# Patient Record
Sex: Male | Born: 1975
Health system: Southern US, Community
[De-identification: ages and names within clinical notes are randomized; demographics above are authoritative.]

## PROBLEM LIST (undated history)

## (undated) DIAGNOSIS — F902 Attention-deficit hyperactivity disorder, combined type: Secondary | ICD-10-CM

## (undated) DIAGNOSIS — E039 Hypothyroidism, unspecified: Secondary | ICD-10-CM

## (undated) DIAGNOSIS — R740 Nonspecific elevation of levels of transaminase and lactic acid dehydrogenase [LDH]: Secondary | ICD-10-CM

## (undated) DIAGNOSIS — F988 Other specified behavioral and emotional disorders with onset usually occurring in childhood and adolescence: Secondary | ICD-10-CM

## (undated) DIAGNOSIS — I10 Essential (primary) hypertension: Secondary | ICD-10-CM

## (undated) DIAGNOSIS — K900 Celiac disease: Secondary | ICD-10-CM

## (undated) DIAGNOSIS — G4730 Sleep apnea, unspecified: Secondary | ICD-10-CM

## (undated) DIAGNOSIS — E538 Deficiency of other specified B group vitamins: Secondary | ICD-10-CM

## (undated) HISTORY — DX: Hypothyroidism, unspecified: E03.9

## (undated) HISTORY — DX: Nonspecific elevation of levels of transaminase and lactic acid dehydrogenase (ldh): R74.0

## (undated) HISTORY — DX: Gilbert syndrome: E80.4

## (undated) HISTORY — PX: WISDOM TOOTH EXTRACTION: SHX21

## (undated) HISTORY — DX: Attention-deficit hyperactivity disorder, combined type: F90.2

## (undated) HISTORY — DX: Deficiency of other specified B group vitamins: E53.8

---

## 1998-06-16 ENCOUNTER — Emergency Department (HOSPITAL_COMMUNITY): Admission: EM | Admit: 1998-06-16 | Discharge: 1998-06-16 | Payer: Self-pay | Admitting: Emergency Medicine

## 1999-03-20 ENCOUNTER — Emergency Department (HOSPITAL_COMMUNITY): Admission: EM | Admit: 1999-03-20 | Discharge: 1999-03-20 | Payer: Self-pay | Admitting: Emergency Medicine

## 1999-11-30 ENCOUNTER — Emergency Department (HOSPITAL_COMMUNITY): Admission: EM | Admit: 1999-11-30 | Discharge: 1999-12-01 | Payer: Self-pay | Admitting: Emergency Medicine

## 2003-08-30 DIAGNOSIS — E039 Hypothyroidism, unspecified: Secondary | ICD-10-CM

## 2003-08-30 HISTORY — DX: Hypothyroidism, unspecified: E03.9

## 2003-08-30 HISTORY — DX: Gilbert syndrome: E80.4

## 2004-02-18 ENCOUNTER — Emergency Department (HOSPITAL_COMMUNITY): Admission: EM | Admit: 2004-02-18 | Discharge: 2004-02-18 | Payer: Self-pay | Admitting: Family Medicine

## 2004-02-20 ENCOUNTER — Emergency Department (HOSPITAL_COMMUNITY): Admission: EM | Admit: 2004-02-20 | Discharge: 2004-02-20 | Payer: Self-pay | Admitting: Emergency Medicine

## 2004-02-28 ENCOUNTER — Emergency Department (HOSPITAL_COMMUNITY): Admission: EM | Admit: 2004-02-28 | Discharge: 2004-02-28 | Payer: Self-pay | Admitting: Family Medicine

## 2004-08-24 ENCOUNTER — Emergency Department (HOSPITAL_COMMUNITY): Admission: EM | Admit: 2004-08-24 | Discharge: 2004-08-24 | Payer: Self-pay | Admitting: Family Medicine

## 2004-08-29 DIAGNOSIS — R7401 Elevation of levels of liver transaminase levels: Secondary | ICD-10-CM

## 2004-08-29 DIAGNOSIS — R7402 Elevation of levels of lactic acid dehydrogenase (LDH): Secondary | ICD-10-CM

## 2004-08-29 HISTORY — DX: Elevation of levels of lactic acid dehydrogenase (LDH): R74.02

## 2004-08-29 HISTORY — DX: Elevation of levels of liver transaminase levels: R74.01

## 2004-11-01 ENCOUNTER — Encounter: Admission: RE | Admit: 2004-11-01 | Discharge: 2004-11-01 | Payer: Self-pay | Admitting: Internal Medicine

## 2004-11-01 ENCOUNTER — Ambulatory Visit: Payer: Self-pay | Admitting: Internal Medicine

## 2004-12-23 ENCOUNTER — Ambulatory Visit: Payer: Self-pay | Admitting: Family Medicine

## 2004-12-29 ENCOUNTER — Ambulatory Visit: Payer: Self-pay | Admitting: Internal Medicine

## 2005-10-04 ENCOUNTER — Ambulatory Visit: Payer: Self-pay | Admitting: Internal Medicine

## 2005-11-07 ENCOUNTER — Ambulatory Visit: Payer: Self-pay | Admitting: Internal Medicine

## 2006-03-23 ENCOUNTER — Emergency Department (HOSPITAL_COMMUNITY): Admission: EM | Admit: 2006-03-23 | Discharge: 2006-03-23 | Payer: Self-pay | Admitting: Family Medicine

## 2006-11-13 ENCOUNTER — Emergency Department (HOSPITAL_COMMUNITY): Admission: EM | Admit: 2006-11-13 | Discharge: 2006-11-13 | Payer: Self-pay | Admitting: Family Medicine

## 2006-11-15 ENCOUNTER — Ambulatory Visit: Payer: Self-pay | Admitting: Internal Medicine

## 2007-06-05 ENCOUNTER — Emergency Department (HOSPITAL_COMMUNITY): Admission: EM | Admit: 2007-06-05 | Discharge: 2007-06-05 | Payer: Self-pay | Admitting: Emergency Medicine

## 2009-11-30 ENCOUNTER — Telehealth (INDEPENDENT_AMBULATORY_CARE_PROVIDER_SITE_OTHER): Payer: Self-pay | Admitting: *Deleted

## 2010-09-28 NOTE — Progress Notes (Signed)
Summary: need rx for pinworms/ chart on ledge  Phone Note Call from Patient Call back at [phone]   Caller: Patient Summary of Call: Pt called has a child in the home diagnosed with pinworms, and need to get a rx. Everyone has a rx except him and was informed need to get rx from pcp  Not symptomatic, need rx for precautionary.  -Last seen 10/04/05, not working now, and has not heard from the [facility] [place], last pcp was Dr Alwyn Ren. --Hop chart on your ledge for eval Initial call taken by: Kandice Hams,  November 30, 2009 2:12 PM  Follow-up for Phone Call        he needs to explain situationto his child's Pediatrician to see if he'll treat other family members. I'm sorry but I can not Rx not having seen him for 4 years Follow-up by: Marga Melnick MD,  November 30, 2009 4:41 PM  Additional Follow-up for Phone Call Additional follow up Details #1::        pt informed .Kandice Hams  November 30, 2009 5:21 PM  Additional Follow-up by: Kandice Hams,  November 30, 2009 5:21 PM

## 2011-11-17 ENCOUNTER — Telehealth: Payer: Self-pay | Admitting: Family Medicine

## 2011-11-17 NOTE — Telephone Encounter (Signed)
Please advise 

## 2011-11-17 NOTE — Telephone Encounter (Signed)
rec to stay with [facility]avors or go to the [facility]

## 2011-11-17 NOTE — Telephone Encounter (Signed)
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 He would like to Re-Establish here, would you be willing to take him on as a new Patient? Please review & advise & I will call Patient back  Pat. Ph# [phone]

## 2012-01-10 ENCOUNTER — Encounter: Payer: Self-pay | Admitting: Internal Medicine

## 2012-01-16 ENCOUNTER — Ambulatory Visit: Payer: Self-pay | Admitting: Family Medicine

## 2012-03-07 ENCOUNTER — Encounter: Payer: Self-pay | Admitting: Internal Medicine

## 2012-03-07 ENCOUNTER — Ambulatory Visit (INDEPENDENT_AMBULATORY_CARE_PROVIDER_SITE_OTHER): Payer: 59 | Admitting: Internal Medicine

## 2012-03-07 VITALS — BP 134/80 | HR 80 | Temp 98.4°F | Resp 14 | Ht 72.3 in | Wt 251.8 lb

## 2012-03-07 DIAGNOSIS — Z0000 Encounter for general adult medical examination without abnormal findings: Secondary | ICD-10-CM

## 2012-03-07 NOTE — Progress Notes (Signed)
  Subjective:    Patient ID: Micheal [name], male    DOB: [birth year], 36 y.o.   MRN: [id number]  HPI  Mr Coronado is here for a physical;acute issues are noted below.      Review of Systems Approximately 12-18 months ago he was placed on low-dose thyroid replacement for abnormal thyroid function tests. His TSH had been 5.77 in 2005; free T4 had been low normal at 0.8. He denies excessive fatigue but he does work third shift with variable schedules. He denies blurred vision, double vision, or loss of vision. He does not have constipation but intermittently has loose stool. He is concerned he may have irritable bowel syndrome. He has noted intermittent cramping in the thorax, thigh, or calves. This is especially prominent after exercise.     Objective:   Physical Exam Gen.:  well-nourished in appearance. Alert, appropriate and cooperative throughout exam. Head: Normocephalic without obvious abnormalities;  pattern alopecia ; beard & moustache Eyes: No corneal or conjunctival inflammation noted. Pupils equal round reactive to light and accommodation. Fundal exam is benign without hemorrhages, exudate, papilledema. Extraocular motion intact. Vision grossly normal.No lid lag Ears: External  ear exam reveals no significant lesions or deformities. Canals clear .TMs normal. Hearing is grossly normal bilaterally. Nose: External nasal exam reveals no deformity or inflammation. Nasal mucosa are pink and moist. No lesions or exudates noted.   Mouth: Oral mucosa and oropharynx reveal no lesions or exudates. Teeth in good repair. Neck: No deformities, masses, or tenderness noted. Range of motion normal Thyroid full without nodularity. Right lobe larger than left Lungs: Normal respiratory effort; chest expands symmetrically. Lungs are clear to auscultation without rales, wheezes, or increased work of breathing. Heart: Normal rate and rhythm. Normal S1 and S2. No gallop, click, or rub. No murmur. Abdomen:  Bowel sounds normal; abdomen soft and nontender. No masses, organomegaly or hernias noted. Genitalia/ DRE: Genitalia normal except forR epididymal granuloma. Prostate is normal without enlargement, asymmetry, nodularity, or induration.                                             Musculoskeletal/extremities: No deformity or scoliosis noted of  the thoracic or lumbar spine. No clubbing, cyanosis, edema, or deformity noted. Range of motion  normal .Tone & strength  normal.Joints normal. Nail health  Good; no onycholysis. Vascular: Carotid, radial artery, dorsalis pedis and  posterior tibial pulses are full and equal. No bruits present. Neurologic: Alert and oriented x3. Deep tendon reflexes symmetrical and normal.No tremor          Skin: Intact without suspicious lesions or rashes. Lymph: No cervical, axillary, or inguinal lymphadenopathy present. Psych: Mood and affect are normal. Normally interactive                                                                                         Assessment & Plan:  #1 comprehensive physical exam; no acute findings #2 see Problem List with Assessments & Recommendations Plan: see Orders

## 2012-03-07 NOTE — Patient Instructions (Addendum)
Preventive Health Care: Exercise at least 30-45 minutes a day,  3-4 days a week.  Eat a low-fat diet with lots of fruits and vegetables, up to 7-9 servings per day. Avoid obesity; your goal is waist measurement < 40 inches.Consume less than 40 grams of sugar per day from foods & drinks with High Fructose Corn Sugar as # 1,2,3 or # 4 on label. Blood Pressure Goal  Ideally is an AVERAGE < 135/85. This AVERAGE should be calculated from @ least 5-7 BP readings taken @ different times of day on different days of week. You should not respond to isolated BP readings , but rather the AVERAGE for that week . Please  schedule fasting Labs : BMET,Lipids, hepatic panel, CBC & dif,free T4,free 3, TSH. PLEASE BRING THESE INSTRUCTIONS TO FOLLOW UP  LAB APPOINTMENT.This will guarantee correct labs are drawn, eliminating need for repeat blood sampling ( needle sticks ! ). Diagnoses /Codes: V70.0. Please try to go on My Chart within the next 24 hours to allow me to release the results directly to you.

## 2012-03-08 ENCOUNTER — Other Ambulatory Visit (INDEPENDENT_AMBULATORY_CARE_PROVIDER_SITE_OTHER): Payer: 59

## 2012-03-08 DIAGNOSIS — Z0000 Encounter for general adult medical examination without abnormal findings: Secondary | ICD-10-CM

## 2012-03-08 LAB — BASIC METABOLIC PANEL
CO2: 26 mEq/L (ref 19–32)
Calcium: 8.9 mg/dL (ref 8.4–10.5)
Chloride: 105 mEq/L (ref 96–112)
Creatinine, Ser: 1.1 mg/dL (ref 0.4–1.5)
Sodium: 139 mEq/L (ref 135–145)

## 2012-03-08 LAB — HEPATIC FUNCTION PANEL
ALT: 38 U/L (ref 0–53)
AST: 28 U/L (ref 0–37)
Albumin: 4.2 g/dL (ref 3.5–5.2)
Bilirubin, Direct: 0.1 mg/dL (ref 0.0–0.3)
Total Bilirubin: 1.4 mg/dL — ABNORMAL HIGH (ref 0.3–1.2)

## 2012-03-08 LAB — CBC WITH DIFFERENTIAL/PLATELET
Basophils Relative: 0.5 % (ref 0.0–3.0)
Eosinophils Absolute: 0.1 10*3/uL (ref 0.0–0.7)
Lymphs Abs: 2.3 10*3/uL (ref 0.7–4.0)
MCHC: 34 g/dL (ref 30.0–36.0)
MCV: 90 fl (ref 78.0–100.0)
Monocytes Absolute: 0.5 10*3/uL (ref 0.1–1.0)
Monocytes Relative: 8.1 % (ref 3.0–12.0)
Neutrophils Relative %: 54 % (ref 43.0–77.0)
Platelets: 151 10*3/uL (ref 150.0–400.0)

## 2012-03-08 LAB — LIPID PANEL: Triglycerides: 105 mg/dL (ref 0.0–149.0)

## 2012-03-08 LAB — T4, FREE: Free T4: 0.7 ng/dL (ref 0.60–1.60)

## 2012-03-08 LAB — TSH: TSH: 7.34 u[IU]/mL — ABNORMAL HIGH (ref 0.35–5.50)

## 2012-03-08 LAB — T3, FREE: T3, Free: 3 pg/mL (ref 2.3–4.2)

## 2012-03-09 ENCOUNTER — Other Ambulatory Visit: Payer: Self-pay

## 2012-03-09 MED ORDER — LEVOTHYROXINE SODIUM 25 MCG PO TABS: 25.0000 ug | ORAL_TABLET | Freq: Every day | ORAL | Status: DC

## 2012-03-09 NOTE — Telephone Encounter (Signed)
Per Dr.Hopper: Labs sent through Mychart, send rx to local pharmacy

## 2012-05-02 ENCOUNTER — Ambulatory Visit (INDEPENDENT_AMBULATORY_CARE_PROVIDER_SITE_OTHER): Payer: 59 | Admitting: Family

## 2012-05-02 ENCOUNTER — Encounter: Payer: Self-pay | Admitting: Family

## 2012-05-02 VITALS — BP 130/88 | HR 86 | Temp 98.6°F | Resp 16 | Wt 249.1 lb

## 2012-05-02 DIAGNOSIS — J40 Bronchitis, not specified as acute or chronic: Secondary | ICD-10-CM

## 2012-05-02 MED ORDER — AZITHROMYCIN 250 MG PO TABS: ORAL_TABLET | ORAL | Status: AC

## 2012-05-02 MED ORDER — HYDROCOD POLST-CHLORPHEN POLST 10-8 MG/5ML PO LQCR: 5.0000 mL | Freq: Every evening | ORAL | Status: DC | PRN

## 2012-05-02 MED ORDER — FLUTICASONE-SALMETEROL 250-50 MCG/DOSE IN AEPB: 1.0000 | INHALATION_SPRAY | Freq: Two times a day (BID) | RESPIRATORY_TRACT | Status: DC

## 2012-05-02 NOTE — Assessment & Plan Note (Signed)
Will plan to rx with zithromax.  Add HS Tussionex for cough.  Also, will plan to treat with advair bid for the next week to help with suspected bronchochospastic component contributing to his cough.

## 2012-05-02 NOTE — Patient Instructions (Addendum)
Please call if your symptoms worsen or if you are not feeling better in 2-3 days.  

## 2012-05-02 NOTE — Progress Notes (Signed)
Subjective:    Patient ID: Micheal [name], male    DOB: Oct 12, 1975, 36 y.o.   MRN: [id number]  HPI  [name] is a 36 yr old male who presents today with chief complaint of cough.  Cough started 9 days ago and is productive of green sputum.  Reports symptoms are worsening. Reports that the floors at his work were recently refinished and the fumes have been bothering him. He reports associated nasal congestion. Using sudafed.  Can't sleep at night due to cough.  Tried delsym, robitussin extra strength.  Neither helped much.  He reports fever 99 something late last week.    Review of Systems See HPI  Past Medical History  Diagnosis Date  . Gilbert syndrome 2005    total bilirubin  1.8  . Nonspecific elevation of levels of transaminase or lactic acid dehydrogenase (LDH) 2006     ALT 51  . Abnormal thyroid blood test 2005    free T4 0.8; TSH 5.77    History   Social History  . Marital Status: Married    Spouse Name: N/A    Number of Children: N/A  . Years of Education: N/A   Occupational History  . Not on file.   Social History Main Topics  . Smoking status: Never Smoker   . Smokeless tobacco: Never Used  . Alcohol Use: Yes     Rarely  . Drug Use: No  . Sexually Active: Not on file   Other Topics Concern  . Not on file   Social History Narrative  . No narrative on file    Past Surgical History  Procedure Date  . Wisdom tooth extraction     as teen    Family History  Problem Relation Age of Onset  . Lung cancer Paternal Grandfather     smoker  . Lung cancer Paternal Grandmother     2nd hand smoke exposure   . Lung cancer Maternal Grandmother     smoker  . Stroke Maternal Grandmother     Mini-stroke  . Hypertension Mother   . Pancreatic cancer Maternal Grandfather   . Diabetes      GGM  . Heart disease Neg Hx   . Deep vein thrombosis Sister 18    BCP & smoker    Allergies  Allergen Reactions  . Calcium Channel Blockers     Chest discomfort;  negative EKG  . Hctz (Hydrochlorothiazide)     cramps    Current Outpatient Prescriptions on File Prior to Visit  Medication Sig Dispense Refill  . levothyroxine (SYNTHROID, LEVOTHROID) 25 MCG tablet Take 1 tablet (25 mcg total) by mouth daily.  91 tablet  0  . Fluticasone-Salmeterol (ADVAIR DISKUS) 250-50 MCG/DOSE AEPB Inhale 1 puff into the lungs 2 (two) times daily.  14 each  0    BP 130/88  Pulse 86  Temp 98.6 F (37 C) (Oral)  Resp 16  Wt 249 lb 1.3 oz (112.982 kg)  SpO2 99%       Objective:   Physical Exam  Constitutional: He is oriented to person, place, and time. He appears well-developed and well-nourished. No distress.  HENT:  Head: Normocephalic and atraumatic.  Right Ear: Tympanic membrane and ear canal normal.  Left Ear: Tympanic membrane and ear canal normal.  Mouth/Throat: No oropharyngeal exudate or posterior oropharyngeal edema.  Cardiovascular: Normal rate and regular rhythm.   No murmur heard. Pulmonary/Chest: Effort normal and breath sounds normal. No respiratory distress. He has no  wheezes. He has no rales. He exhibits no tenderness.  Musculoskeletal: He exhibits no edema.  Neurological: He is alert and oriented to person, place, and time.  Psychiatric: He has a normal mood and affect. His behavior is normal. Judgment and thought content normal.          Assessment & Plan:

## 2012-05-08 ENCOUNTER — Telehealth: Payer: Self-pay | Admitting: Internal Medicine

## 2012-05-08 NOTE — Telephone Encounter (Signed)
I called and spoke with patient, patient asked if we tested for his blood type at last lab check. Patient informed we do not usually do blood type testing for insurance will not cover just because . . . Patient verbalized understanding. I informed patient the best/easiest way to find out blood type would be to donate blood

## 2012-05-08 NOTE — Telephone Encounter (Signed)
called triage line LM cb# [phone]--XB specific details left

## 2012-07-18 ENCOUNTER — Other Ambulatory Visit (INDEPENDENT_AMBULATORY_CARE_PROVIDER_SITE_OTHER): Payer: 59

## 2012-07-18 ENCOUNTER — Other Ambulatory Visit: Payer: Self-pay | Admitting: Internal Medicine

## 2012-07-18 DIAGNOSIS — E039 Hypothyroidism, unspecified: Secondary | ICD-10-CM

## 2012-07-19 LAB — TSH: TSH: 5.35 u[IU]/mL (ref 0.35–5.50)

## 2012-08-27 ENCOUNTER — Other Ambulatory Visit: Payer: Self-pay | Admitting: *Deleted

## 2012-08-27 DIAGNOSIS — E079 Disorder of thyroid, unspecified: Secondary | ICD-10-CM

## 2012-08-27 MED ORDER — LEVOTHYROXINE SODIUM 25 MCG PO TABS: 25.0000 ug | ORAL_TABLET | Freq: Every day | ORAL | Status: DC

## 2012-08-27 NOTE — Telephone Encounter (Signed)
Refill for synthroid sent to [facility] pharmacy

## 2012-10-13 ENCOUNTER — Other Ambulatory Visit: Payer: Self-pay

## 2012-11-21 ENCOUNTER — Telehealth: Payer: Self-pay | Admitting: Internal Medicine

## 2012-11-21 NOTE — Telephone Encounter (Signed)
Patient states he is due to have his TSH checked in May. He would like to have this done at the hospital if possible. He is a Runner, broadcasting/film/video. Please advise if we are able to do this.

## 2012-11-22 NOTE — Telephone Encounter (Signed)
Left message on VM informing patient to return call when available. Reason for call: we can fax an order to desired fax number, we are unable to place future orders for the hospital.

## 2012-11-22 NOTE — Telephone Encounter (Signed)
Patient called back.  Please call him.

## 2012-11-22 NOTE — Telephone Encounter (Signed)
Spoke with patient, patient verbalized understanding that we can fax him an order. Patient requested that we send info through MyChart and he will print it off and take to facility where he will have labs drawn.

## 2013-02-02 ENCOUNTER — Other Ambulatory Visit: Payer: Self-pay | Admitting: Internal Medicine

## 2013-02-03 LAB — TSH: TSH: 4.971 u[IU]/mL — ABNORMAL HIGH (ref 0.350–4.500)

## 2013-02-21 ENCOUNTER — Telehealth: Payer: Self-pay | Admitting: Internal Medicine

## 2013-02-21 ENCOUNTER — Ambulatory Visit (INDEPENDENT_AMBULATORY_CARE_PROVIDER_SITE_OTHER): Payer: 59 | Admitting: Internal Medicine

## 2013-02-21 VITALS — BP 140/90 | HR 91 | Temp 98.2°F | Wt 257.0 lb

## 2013-02-21 DIAGNOSIS — N489 Disorder of penis, unspecified: Secondary | ICD-10-CM

## 2013-02-21 DIAGNOSIS — E039 Hypothyroidism, unspecified: Secondary | ICD-10-CM

## 2013-02-21 DIAGNOSIS — L639 Alopecia areata, unspecified: Secondary | ICD-10-CM

## 2013-02-21 DIAGNOSIS — R21 Rash and other nonspecific skin eruption: Secondary | ICD-10-CM

## 2013-02-21 MED ORDER — SELENIUM SULF-PYRITHIONE-UREA 2.3 % EX SHAM: 1.0000 | MEDICATED_SHAMPOO | Freq: Every day | CUTANEOUS | Status: DC

## 2013-02-21 NOTE — Telephone Encounter (Signed)
Discuss with patient  

## 2013-02-21 NOTE — Telephone Encounter (Signed)
Only if FBS was > 100 on prior labs

## 2013-02-21 NOTE — Telephone Encounter (Signed)
Please advise 

## 2013-02-21 NOTE — Telephone Encounter (Signed)
PT wanted to let dr hopper know that he did not have A1C done. Wanted to know if dr hopper would like for him to set up a lab appt to have it done. thanks

## 2013-02-22 NOTE — Progress Notes (Signed)
  Subjective:    Patient ID: Micheal [name], male    DOB: 02-15-1976, 37 y.o.   MRN: [id number]  HPI  He has had an intermittent rash in intertriginous areas between the toes over the last 1-2 years. This is variable in intensity and improves with improved hygiene, maintaining clean dry feet  He's also intermittently had white spots which appeared to be small blisters over the sole of the right foot. These were pruritic in nature.  He's also had intermittent pruritic rash in his groin and a recent lesion of the right penis.  One month ago he noted localized alopecia over the right retroauricular area. This has not been treated.  He has not consulted a dermatologist as he has a high medical deductible of several thousand dollars   Review of Systems  He is having difficulty remembering to take his thyroid supplement. He misses doses up to one week that time. He blames this on ADD.  He states that his glucose was normal on lab work done at his job.  He denies polyuria, polydipsia, polyphasia.     Objective:   Physical Exam  He appears healthy and well-nourished in no distress.  He has no lymphadenopathy about the head neck or axilla.  The right thyroid lobe is enlarged without nodularity.  He has an S4 with no significant murmurs.  He has significant pes planus.  No rash is present over the foot at this time.  He has a 10 x 10 mm bland plaque-like lesion over the right lateral penis.  There is an area of alopecia areata 30 x 15 mm over the right retroauricular area.        Assessment & Plan:  #1 alopecia area rule out tinea capitis; selenium sulfide 2.5 percent shampoo was recommended daily. If there's no improvement dermatology referral is recommended.  #2 hypothyroidism with medication noncompliance. Pathophysiology of progressive goiter was discussed. Stools were recommended for guarantee compliance with his thyroid supplementation.  #3 dermatitis; not active at  this time  #4 possible fungal pedal lesion. Nizoral daily was recommended. This could also be used for the groin rash as well as intertriginous rash when present. It should be applied once daily and blown dry with a hair drier.

## 2013-02-22 NOTE — Patient Instructions (Signed)
Rx sent to [facility] for Nizoral & selenium shampoo

## 2013-02-28 ENCOUNTER — Encounter: Payer: Self-pay | Admitting: *Deleted

## 2013-02-28 ENCOUNTER — Emergency Department
Admission: EM | Admit: 2013-02-28 | Discharge: 2013-02-28 | Disposition: A | Discharge status: Home / Self Care | Payer: 59 | Source: Home / Self Care | Attending: Family Medicine | Admitting: Family Medicine

## 2013-02-28 ENCOUNTER — Emergency Department (INDEPENDENT_AMBULATORY_CARE_PROVIDER_SITE_OTHER): Payer: 59

## 2013-02-28 DIAGNOSIS — M6740 Ganglion, unspecified site: Secondary | ICD-10-CM

## 2013-02-28 DIAGNOSIS — M79609 Pain in unspecified limb: Secondary | ICD-10-CM

## 2013-02-28 NOTE — ED Provider Notes (Signed)
History    CSN: [id number] Arrival date & time 02/28/13  1730  First MD Initiated Contact with Patient 02/28/13 1732     Chief Complaint  Patient presents with  . Hand Pain    left index finger    HPI L index finger pain x 1 week  Has noticed progressive lump and mild pain  No redness, fevers, chills.  Pain present with gripping objects and direct deep palpation No numbness or paresthesias Grip strength and finger/hand function intact.    Past Medical History  Diagnosis Date  . Gilbert syndrome 2005    total bilirubin  1.8  . Nonspecific elevation of levels of transaminase or lactic acid dehydrogenase (LDH) 2006     ALT 51  . Abnormal thyroid blood test 2005    free T4 0.8; TSH 5.77   Past Surgical History  Procedure Laterality Date  . Wisdom tooth extraction      as teen   Family History  Problem Relation Age of Onset  . Lung cancer Paternal Grandfather     smoker  . Lung cancer Paternal Grandmother     2nd hand smoke exposure   . Lung cancer Maternal Grandmother     smoker  . Stroke Maternal Grandmother     Mini-stroke  . Hypertension Mother   . Pancreatic cancer Maternal Grandfather   . Diabetes      GGM  . Heart disease Neg Hx   . Deep vein thrombosis Sister 18    BCP & smoker   History  Substance Use Topics  . Smoking status: Never Smoker   . Smokeless tobacco: Never Used  . Alcohol Use: Yes     Comment: Rarely    Review of Systems  All other systems reviewed and are negative.    Allergies  Calcium channel blockers and Hctz  Home Medications   Current Outpatient Rx  Name  Route  Sig  Dispense  Refill  . levothyroxine (SYNTHROID, LEVOTHROID) 25 MCG tablet   Oral   Take 1 tablet (25 mcg total) by mouth daily.   91 tablet   0   . Selenium Sulf-Pyrithione-Urea 2.3 % SHAM   Apply externally   Apply 1 Squirt topically daily. Shampoo once daily for 2 weeks.   180 mL   0    BP 142/98  Pulse 72  Temp(Src) 98.3 F (36.8 C) (Oral)   Resp 16  Ht 5' 11.5" (1.816 m)  Wt 256 lb (116.121 kg)  BMI 35.21 kg/m2  SpO2 100% Physical Exam  Constitutional: He appears well-developed and well-nourished.  HENT:  Head: Normocephalic and atraumatic.  Eyes: Pupils are equal, round, and reactive to light.  Neck: Normal range of motion.  Cardiovascular: Normal rate, regular rhythm and normal heart sounds.   Pulmonary/Chest: Effort normal.  Abdominal: Soft.  Musculoskeletal:       Hands: Small cystic formation on medial-proximal base of L index finger No redness Minimal tenderness   Neurological: He is alert.  Skin: Skin is warm.    ED Course  Procedures (including critical care time) Labs Reviewed - No data to display Dg Finger Index Left  02/28/2013   *RADIOLOGY REPORT*  Clinical Data: Left index finger pain.  LEFT INDEX FINGER 2+V  Comparison: None.  Findings: No fracture, foreign body, or acute bony findings are identified.  IMPRESSION:  No significant abnormality identified.   Original Report Authenticated By: Gaylyn Rong, M.D.   1. Ganglion cyst     MDM  Findings most consistent with ganglion cyst.  Discussed supportive care Plan for follow up with sports medicine for likely ultrasound of affected area.  Handout given.  Discussed MSK red flags.  Follow up as needed.     The patient and/or caregiver has been counseled thoroughly with regard to treatment plan and/or medications prescribed including dosage, schedule, interactions, rationale for use, and possible side effects and they verbalize understanding. Diagnoses and expected course of recovery discussed and will return if not improved as expected or if the condition worsens. Patient and/or caregiver verbalized understanding.       Doree Albee, MD 02/28/13 1840

## 2013-02-28 NOTE — ED Notes (Signed)
Micheal [name] c/o soreness to his left index finger without injury.

## 2013-03-04 ENCOUNTER — Other Ambulatory Visit: Payer: Self-pay | Admitting: Internal Medicine

## 2013-03-04 ENCOUNTER — Telehealth: Payer: Self-pay | Admitting: Internal Medicine

## 2013-03-04 DIAGNOSIS — M6740 Ganglion, unspecified site: Secondary | ICD-10-CM

## 2013-03-04 NOTE — Telephone Encounter (Signed)
Caller: Micheal [name]/Patient; Phone: [phone]; Reason for Call: Seen at [facility] 02/28/13 for knot on left index finger; diagnosed with ganglion cyst and referred to specialist in [place] area.  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  Asking if Dr Alwyn Ren would treat this or would refer to provider in [facility] network in [place] or [place] area.  Please call back.

## 2013-03-04 NOTE — Telephone Encounter (Signed)
Recommend Dr Amanda Pea , hand Surgeon with [facility]

## 2013-03-04 NOTE — Telephone Encounter (Signed)
Hopp please advise  

## 2013-03-04 NOTE — Telephone Encounter (Signed)
Spoke with patient, patient aware referral to be set up.

## 2013-07-04 ENCOUNTER — Other Ambulatory Visit: Payer: Self-pay

## 2013-07-15 ENCOUNTER — Telehealth: Payer: Self-pay

## 2013-07-15 NOTE — Telephone Encounter (Signed)
Left message for call back Non identifiable  

## 2013-07-16 ENCOUNTER — Ambulatory Visit (INDEPENDENT_AMBULATORY_CARE_PROVIDER_SITE_OTHER): Payer: 59 | Admitting: Internal Medicine

## 2013-07-16 ENCOUNTER — Encounter: Payer: Self-pay | Admitting: Internal Medicine

## 2013-07-16 VITALS — BP 152/95 | HR 77 | Temp 98.5°F | Ht 71.25 in | Wt 262.4 lb

## 2013-07-16 DIAGNOSIS — R252 Cramp and spasm: Secondary | ICD-10-CM | POA: Insufficient documentation

## 2013-07-16 DIAGNOSIS — Z0000 Encounter for general adult medical examination without abnormal findings: Secondary | ICD-10-CM

## 2013-07-16 NOTE — Progress Notes (Signed)
Pre visit review using our clinic review tool, if applicable. No additional management support is needed unless otherwise documented below in the visit note. 

## 2013-07-16 NOTE — Patient Instructions (Addendum)
Your next office appointment will be determined based upon review of your pending labs . Those instructions will be transmitted to you through My Chart  Minimal Blood Pressure Goal= AVERAGE < 140/90;  Ideal is an AVERAGE < 135/85. This AVERAGE should be calculated from @ least 5-7 BP readings taken @ different times of day on different days of week. You should not respond to isolated BP readings , but rather the AVERAGE for that week .Please bring your  blood pressure cuff to office visits to verify that it is reliable.It  can also be checked against the blood pressure device at the pharmacy. Finger or wrist cuffs are not dependable; an arm cuff is. Call to schedule sleep apnea evaluation when desired.

## 2013-07-16 NOTE — Progress Notes (Signed)
  Subjective:    Patient ID: Micheal [name], male    DOB: 1975-09-29, 37 y.o.   MRN: [id number]  HPI  He is here for a physical;acute issues include ongoing muscle cramps.     Review of Systems No specific diet is followed; no regular exercise . Specifically denied are  chest pain, palpitations, dyspnea, or claudication.  Intermittently he has dry mouth. Polyuria, polyphagia, polydipsia absent. There is no blurred vision, double vision, or loss of vision.  Also denied are numbness, tingling, or burning of the extremities. No nonhealing skin lesions present. Weight is stable.  BP @ work 120s/80s. He has up to 4 BMs in am which are loose. His wife is concerned about sleep apnea based on snoring & possible apneic spells. He wants to defer evaluation until after 08/2013.      Objective:   Physical Exam  Gen.:  well-nourished in appearance. Alert, appropriate and cooperative throughout exam. Head: Normocephalic without obvious abnormalities; pattern alopecia . Beard & moustache Eyes: No corneal or conjunctival inflammation noted. Pupils equal round reactive to light and accommodation. Extraocular motion intact.  Ears: External  ear exam reveals no significant lesions or deformities. Canals clear .TMs normal. Hearing is grossly normal bilaterally. Nose: External nasal exam reveals no deformity or inflammation. Nasal mucosa are pink and moist. No lesions or exudates noted.   Mouth: Oral mucosa and oropharynx reveal no lesions or exudates. Teeth in good repair. Neck: No deformities, masses, or tenderness noted. Range of motion normal. Thyroid :R goiter Lungs: Normal respiratory effort; chest expands symmetrically. Lungs are clear to auscultation without rales, wheezes, or increased work of breathing. Heart: Normal rate and rhythm. Normal S1 and S2. No gallop, click, or rub. S4 w/o murmur. Abdomen: Bowel sounds normal; abdomen soft and nontender. No masses, organomegaly or hernias  noted. Genitalia: deferral requested based on 7/13 exam                                Musculoskeletal/extremities: Thoracic musculature asymmetry suggesting scoliosis of  the thoracic. No clubbing, cyanosis, edema, or significant extremity  deformity noted. Range of motion normal .Tone & strength normal. Hand joints normal . Fingernail / toenail health good. Able to lie down & sit up w/o help. Negative SLR bilaterally Vascular: Carotid, radial artery, dorsalis pedis and  posterior tibial pulses are full and equal. No bruits present. Neurologic: Alert and oriented x3. Deep tendon reflexes symmetrical and normal.       Skin: Intact without suspicious lesions or rashes. Lymph: No cervical, axillary, or inguinal lymphadenopathy present. Psych: Mood and affect are normal. Normally interactive                                                                                        Assessment & Plan:  #1 comprehensive physical exam; no acute findings  Plan: see Orders  & Recommendations

## 2013-07-17 LAB — HEPATIC FUNCTION PANEL
ALT: 54 U/L — ABNORMAL HIGH (ref 0–53)
AST: 31 U/L (ref 0–37)
Albumin: 4.2 g/dL (ref 3.5–5.2)
Alkaline Phosphatase: 106 U/L (ref 39–117)
Total Bilirubin: 1.3 mg/dL — ABNORMAL HIGH (ref 0.3–1.2)

## 2013-07-17 LAB — CBC WITH DIFFERENTIAL/PLATELET
Basophils Absolute: 0 10*3/uL (ref 0.0–0.1)
Eosinophils Relative: 0.7 % (ref 0.0–5.0)
Hemoglobin: 15.6 g/dL (ref 13.0–17.0)
Lymphocytes Relative: 26.1 % (ref 12.0–46.0)
Monocytes Relative: 8.1 % (ref 3.0–12.0)
Neutro Abs: 4.6 10*3/uL (ref 1.4–7.7)
Neutrophils Relative %: 64.5 % (ref 43.0–77.0)
Platelets: 169 10*3/uL (ref 150.0–400.0)
RBC: 5.11 Mil/uL (ref 4.22–5.81)

## 2013-07-17 LAB — BASIC METABOLIC PANEL
CO2: 26 mEq/L (ref 19–32)
Calcium: 8.7 mg/dL (ref 8.4–10.5)
Creatinine, Ser: 0.9 mg/dL (ref 0.4–1.5)
Glucose, Bld: 79 mg/dL (ref 70–99)
Potassium: 3.9 mEq/L (ref 3.5–5.1)
Sodium: 138 mEq/L (ref 135–145)

## 2013-07-17 LAB — TSH: TSH: 7.25 u[IU]/mL — ABNORMAL HIGH (ref 0.35–5.50)

## 2013-07-17 LAB — CK: Total CK: 82 U/L (ref 7–232)

## 2013-07-17 LAB — LIPID PANEL: Triglycerides: 167 mg/dL — ABNORMAL HIGH (ref 0.0–149.0)

## 2013-07-18 ENCOUNTER — Encounter: Payer: Self-pay | Admitting: Internal Medicine

## 2013-07-18 ENCOUNTER — Other Ambulatory Visit: Payer: Self-pay | Admitting: Internal Medicine

## 2013-07-18 DIAGNOSIS — E039 Hypothyroidism, unspecified: Secondary | ICD-10-CM

## 2013-07-18 MED ORDER — LEVOTHYROXINE SODIUM 25 MCG PO TABS: ORAL_TABLET | ORAL | Status: DC

## 2013-07-18 NOTE — Telephone Encounter (Signed)
Unable to reach pre visit.  

## 2013-08-07 ENCOUNTER — Other Ambulatory Visit: Payer: Self-pay | Admitting: *Deleted

## 2013-08-07 ENCOUNTER — Telehealth: Payer: Self-pay | Admitting: Internal Medicine

## 2013-08-07 DIAGNOSIS — E039 Hypothyroidism, unspecified: Secondary | ICD-10-CM

## 2013-08-07 MED ORDER — LEVOTHYROXINE SODIUM 25 MCG PO TABS: ORAL_TABLET | ORAL | Status: DC

## 2013-08-07 NOTE — Telephone Encounter (Signed)
Called and spoke with patient. He wanted to see Dr. Alwyn Ren this Friday. An appt was made for 10:00 am. JG//CMA

## 2013-08-07 NOTE — Telephone Encounter (Signed)
Either OV here or evaluation by ENT

## 2013-08-07 NOTE — Telephone Encounter (Signed)
Please advise 

## 2013-08-07 NOTE — Telephone Encounter (Signed)
Patient states that he is "smelling stuff that is not there." He smells smoke and fire for no reason and has been since Friday off and on. Offered patient an OV but he declined and wants to know what Dr. Alwyn Ren wants him to do. Patient left a VM on the triage VM on Monday but has not gotten a response.

## 2013-08-09 ENCOUNTER — Ambulatory Visit (INDEPENDENT_AMBULATORY_CARE_PROVIDER_SITE_OTHER): Payer: 59 | Admitting: Internal Medicine

## 2013-08-09 ENCOUNTER — Encounter: Payer: Self-pay | Admitting: Internal Medicine

## 2013-08-09 VITALS — BP 132/84 | HR 70 | Temp 98.2°F | Ht 71.25 in | Wt 259.0 lb

## 2013-08-09 DIAGNOSIS — R439 Unspecified disturbances of smell and taste: Secondary | ICD-10-CM

## 2013-08-09 DIAGNOSIS — R431 Parosmia: Secondary | ICD-10-CM

## 2013-08-09 DIAGNOSIS — J069 Acute upper respiratory infection, unspecified: Secondary | ICD-10-CM

## 2013-08-09 NOTE — Progress Notes (Signed)
Pre visit review using our clinic review tool, if applicable. No additional management support is needed unless otherwise documented below in the visit note. 

## 2013-08-09 NOTE — Patient Instructions (Signed)
Plain Mucinex (NOT D) for thick secretions ;force NON dairy fluids .   Nasal cleansing in the shower as discussed with lather of mild shampoo.After 10 seconds wash off lather while  exhaling through nostrils. Make sure that all residual soap is removed to prevent irritation.  Nasacort AQOTC1 spray in each nostril twice a day as needed. Use the "crossover" technique into opposite nostril spraying toward opposite ear @ 45 degree angle, not straight up into nostril.  Use a Neti pot daily only  as needed for significant sinus congestion; going from open side to congested side . Plain Allegra (NOT D )  160 daily , Loratidine 10 mg , OR Zyrtec 10 mg @ bedtime  as needed for itchy eyes & sneezing. Zicam Melts or Zinc lozenges ; vitamin C 2000 mg daily; & Echinacea for 4-7 days. Report fever, exudate("pus") or progressive pain.

## 2013-08-09 NOTE — Progress Notes (Signed)
   Subjective:    Patient ID: Micheal [name], male    DOB: June 05, 1976, 37 y.o.   MRN: [id number]  HPI   Several months ago he had self-limited abnormal smell over 3 days which he described as burning or the scent of an ashtray.  This recurred 12/5 and was intermittent until he developed upper respiratory tract infection 12/10  Since 12/10 he's had rhinitis and a cough with clear sputum. There has been minimal green color to the sputum; he also has had minor sneezing. All nasal discharge has been clear   He has had some pressure discomfort over the anterior temples bilaterally.      Review of Systems The abnormal smell was not associated with diplopia, blurred vision, loss of vision. He also denied hearing loss or tinnitus.  He had no associated hoarseness or difficulty swallowing. There is no definite frontal sinus pain, facial pain, nasal purulence, otic pain, otic discharge.  He's had no fever, chills, or sweats.      Objective:   Physical Exam General appearance:good health ;well nourished; no acute distress or increased work of breathing is present.  No  lymphadenopathy about the head, neck, or axilla noted.   Eyes: No conjunctival inflammation or lid edema is present. Extraocular motion is intact. Field of vision is normal. Vision is normal to confrontation without  lenses  Ears:  External ear exam shows no significant lesions or deformities.  Otoscopic examination reveals clear canals, tympanic membranes are intact bilaterally without bulging, retraction, inflammation or discharge. Tuning fork exam is normal  Nose:  External nasal examination shows no deformity or inflammation. Nasal mucosa are boggy and moist without lesions or exudates. No septal dislocation or deviation.No obstruction to airflow. He was able to smell chocolate  Oral exam: Dental hygiene is good; lips and gums are healthy appearing.There is no oropharyngeal erythema or exudate noted. He identified salt  placed on his tongue as bitter. He had difficulty identifying a small amount of pepper placed on his tongue  Neck:  No deformities,  masses, or tenderness noted.      Heart:  Normal rate and regular rhythm. S1 and S2 normal without gallop, murmur, click, rub or other extra sounds.   Lungs:Chest clear to auscultation; no wheezes, rhonchi,rales ,or rubs present.No increased work of breathing.    Neuro: No cranial nerve deficit present     Skin: Warm & dry w/o jaundice or tenting.         Assessment & Plan:  #1 subjective abnormal smell, resolved with acute viral upper respiratory infection. No sensory deficit identified.  Plan: Aggressive nasal hygiene will be introduced. If the sensory abnormalities recur and persist ENT consult recommended.

## 2013-08-15 ENCOUNTER — Other Ambulatory Visit: Payer: Self-pay | Admitting: *Deleted

## 2013-08-15 DIAGNOSIS — E039 Hypothyroidism, unspecified: Secondary | ICD-10-CM

## 2013-08-15 MED ORDER — LEVOTHYROXINE SODIUM 25 MCG PO TABS: ORAL_TABLET | ORAL | Status: DC

## 2013-09-09 ENCOUNTER — Telehealth: Payer: Self-pay | Admitting: Internal Medicine

## 2013-09-09 ENCOUNTER — Other Ambulatory Visit: Payer: Self-pay | Admitting: *Deleted

## 2013-09-09 DIAGNOSIS — E039 Hypothyroidism, unspecified: Secondary | ICD-10-CM

## 2013-09-09 MED ORDER — LEVOTHYROXINE SODIUM 25 MCG PO TABS: ORAL_TABLET | ORAL | Status: DC

## 2013-09-09 NOTE — Telephone Encounter (Signed)
Synthroid refilled per protocol. JG//CMA 

## 2013-09-09 NOTE — Telephone Encounter (Signed)
Patient called requesting to speak with a Production designer, theatre/television/filmmanager. Advised him that no one was available at this time but that I would have someone call him back.   [facility] that [facility] for his Synthroid and needs for us to resend this. Patient says he has left to VM's on the triage VM but has not heard back for the two weeks he has been calling. Please advise.

## 2013-11-05 ENCOUNTER — Telehealth: Payer: Self-pay | Admitting: *Deleted

## 2013-11-05 DIAGNOSIS — E039 Hypothyroidism, unspecified: Secondary | ICD-10-CM

## 2013-11-05 MED ORDER — LEVOTHYROXINE SODIUM 25 MCG PO TABS: ORAL_TABLET | ORAL | Status: DC

## 2013-11-05 NOTE — Telephone Encounter (Signed)
Followup labs needed at this time; these were entered as future orders in November. His TSH and liver function tests were abnormal.  I reviewed the chart; there's no diagnosis of sleep apnea. Unfortunately I'll need to see him to generate the referral note to Dr.Clance. Without this note; insurance and the subspecialist will not authorize the referral. This is not my policy; I am sorry.

## 2013-11-05 NOTE — Telephone Encounter (Signed)
Patient phoned needing refill on levothyroxine (and to confirm generic available) and is also requesting referral for sleep study (Clance).  Please advise.  CB# [phone]

## 2013-11-05 NOTE — Telephone Encounter (Signed)
Phoned & notified patient of MD response.  Patient was under the understanding this topic had already been discussed with PCP in prior visit. Stated he would schedule an OV

## 2013-11-05 NOTE — Telephone Encounter (Signed)
Please get the labs which were scheduled as future labs prior to the office visit. We will review them at that time

## 2013-11-06 ENCOUNTER — Ambulatory Visit (INDEPENDENT_AMBULATORY_CARE_PROVIDER_SITE_OTHER): Payer: 59 | Admitting: Internal Medicine

## 2013-11-06 ENCOUNTER — Encounter: Payer: Self-pay | Admitting: Internal Medicine

## 2013-11-06 VITALS — BP 126/80 | HR 61 | Temp 98.1°F | Resp 15 | Wt 256.6 lb

## 2013-11-06 DIAGNOSIS — R7401 Elevation of levels of liver transaminase levels: Secondary | ICD-10-CM

## 2013-11-06 DIAGNOSIS — K1379 Other lesions of oral mucosa: Secondary | ICD-10-CM | POA: Insufficient documentation

## 2013-11-06 DIAGNOSIS — E039 Hypothyroidism, unspecified: Secondary | ICD-10-CM

## 2013-11-06 DIAGNOSIS — Z9114 Patient's other noncompliance with medication regimen: Secondary | ICD-10-CM | POA: Insufficient documentation

## 2013-11-06 DIAGNOSIS — Z91199 Patient's noncompliance with other medical treatment and regimen due to unspecified reason: Secondary | ICD-10-CM

## 2013-11-06 DIAGNOSIS — Z91148 Patient's other noncompliance with medication regimen for other reason: Secondary | ICD-10-CM | POA: Insufficient documentation

## 2013-11-06 DIAGNOSIS — Z87898 Personal history of other specified conditions: Secondary | ICD-10-CM | POA: Insufficient documentation

## 2013-11-06 DIAGNOSIS — K589 Irritable bowel syndrome without diarrhea: Secondary | ICD-10-CM | POA: Insufficient documentation

## 2013-11-06 DIAGNOSIS — R740 Nonspecific elevation of levels of transaminase and lactic acid dehydrogenase [LDH]: Secondary | ICD-10-CM

## 2013-11-06 DIAGNOSIS — K1370 Unspecified lesions of oral mucosa: Secondary | ICD-10-CM

## 2013-11-06 DIAGNOSIS — Z9119 Patient's noncompliance with other medical treatment and regimen: Secondary | ICD-10-CM

## 2013-11-06 DIAGNOSIS — R7402 Elevation of levels of lactic acid dehydrogenase (LDH): Secondary | ICD-10-CM

## 2013-11-06 DIAGNOSIS — Z9189 Other specified personal risk factors, not elsewhere classified: Secondary | ICD-10-CM

## 2013-11-06 NOTE — Assessment & Plan Note (Signed)
Hypoallergenic foods discussed; Pulmonary referral to evaluate this as well as possible sleep apnea.

## 2013-11-06 NOTE — Assessment & Plan Note (Signed)
Schedule fasting LFT

## 2013-11-06 NOTE — Progress Notes (Signed)
Subjective:    Patient ID: Micheal [name], male    DOB: 01/16/1976, 38 y.o.   MRN: [id number]  HPI  He is here to followup of his hypothyroidism. In November his TSH was 7.25; it was recommended that he increase his thyroid to 25 mcg 1-1/2 pills daily. He will typically take this dose 2-3 times per week. He will miss total doses for up to a week at a time. He has not followed up on the TSH as recommended. He states he simply has trouble remembering to take the medication.  His second concern is possible sleep apnea. He describes very loud snoring. He describes daytime fatigue. He has intermittent hypersomnolence. 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  He questions relationship to intermittent swelling of his uvula. Has occurred twice in the last month. Previously it occurred rarely every couple months. He cannot identify any specific trigger for this.  There is no associated swelling of the lips or tongue. He is not on ACE inhibitor.    Review of Systems  Constitutional: No significant change in weight; significant  change in appetite. Eye: no blurred, double ,loss of vision Cardiovascular: no palpitations; racing; irregularity ENT/GI: no constipation; diarrhea;hoarseness;dysphagia (he will not eat if uvula swollen)  Loose stool especially in am, up to 3 Derm: no change in nails,hair,skin Neuro: no numbness or tingling; tremor Psych:no anxiety; depression; panic attacks Endo: some temperature intolerance to heat        Objective:   Physical Exam Gen.: Weight excess;adequately nourished in appearance. Alert and cooperative throughout exam.  Head: Normocephalic without obvious abnormalities;pattern alopecia  Eyes: No corneal or conjunctival inflammation noted. Pupils equal round reactive to light and accommodation.No lid lag , proptosis .Unsustained lateral nystagmus Extraocular motion intact.papilledema.   Nose: External  nasal exam reveals no deformity or inflammation. Nasal mucosa are pink and moist. No lesions or exudates noted.   Mouth: Oral mucosa and oropharynx reveal no lesions or exudates. Teeth in good repair. No significant uvular edema present. Neck: No deformities, masses, or tenderness noted. Motor formation on the right. No definite nodules palpable.   Lungs: Normal respiratory effort; chest expands symmetrically. Lungs are clear to auscultation without rales, wheezes, or increased work of breathing. Heart: Normal rate and rhythm. Normal S1 and S2. No gallop, click, or rub. No murmur. Abdomen: Bowel sounds normal; abdomen soft and nontender. No masses, organomegaly or hernias noted. Striae                               Musculoskeletal/extremities: No deformity or scoliosis noted of  the thoracic or lumbar spine.  No clubbing, cyanosis, edema, or significant extremity  deformity noted. Tone & strength normal. Hand joints normal  Fingernails bitten. Able to lie down & sit up w/o help.  Vascular: Carotid, radial artery, dorsalis pedis and  posterior tibial pulses are full and equal. No bruits present. Neurologic: Alert and oriented x3. Deep tendon reflexes symmetrical but 0-1/2+. Gait normal     Skin: Intact without suspicious lesions or rashes. Lymph: No cervical, axillary lymphadenopathy present. Psych: Mood and affect are normal. Normally interactive  Assessment & Plan:  See Current Assessment & Plan in Problem List under specific Diagnosis

## 2013-11-06 NOTE — Assessment & Plan Note (Signed)
Probiotic trial  

## 2013-11-06 NOTE — Progress Notes (Signed)
Pre visit review using our clinic review tool, if applicable. No additional management support is needed unless otherwise documented below in the visit note. 

## 2013-11-06 NOTE — Assessment & Plan Note (Signed)
Schedule TSH to assess present status.

## 2013-11-06 NOTE — Patient Instructions (Addendum)
Please come in for fasting labs. These will include a TSH as well as a fasting liver function test. Bring this sheet to verify the correct tests are done. These were entered as future orders in November 2014. Please take the probiotic , Florastor OR Align, every day until the bowels are normal. This will replace the normal bacteria which  are necessary for formation of normal stool and processing of food.

## 2013-11-06 NOTE — Assessment & Plan Note (Signed)
Risks discussed 

## 2013-11-07 ENCOUNTER — Other Ambulatory Visit (INDEPENDENT_AMBULATORY_CARE_PROVIDER_SITE_OTHER): Payer: 59

## 2013-11-07 DIAGNOSIS — R740 Nonspecific elevation of levels of transaminase and lactic acid dehydrogenase [LDH]: Secondary | ICD-10-CM

## 2013-11-07 DIAGNOSIS — E039 Hypothyroidism, unspecified: Secondary | ICD-10-CM

## 2013-11-07 DIAGNOSIS — R7401 Elevation of levels of liver transaminase levels: Secondary | ICD-10-CM

## 2013-11-07 DIAGNOSIS — R7402 Elevation of levels of lactic acid dehydrogenase (LDH): Secondary | ICD-10-CM

## 2013-11-07 LAB — AST: AST: 27 U/L (ref 0–37)

## 2013-11-07 LAB — ALT: ALT: 42 U/L (ref 0–53)

## 2013-11-07 LAB — TSH: TSH: 6.08 u[IU]/mL — AB (ref 0.35–5.50)

## 2013-11-20 ENCOUNTER — Institutional Professional Consult (permissible substitution): Payer: 59 | Admitting: Pulmonary Disease

## 2013-11-21 IMAGING — CR DG FINGER INDEX 2+V*L*
1 series · 1 of 1 positions shown · non-contrast
Comparison: None.

CLINICAL DATA: Left index finger pain.

LEFT INDEX FINGER 2+V

[view not recorded]
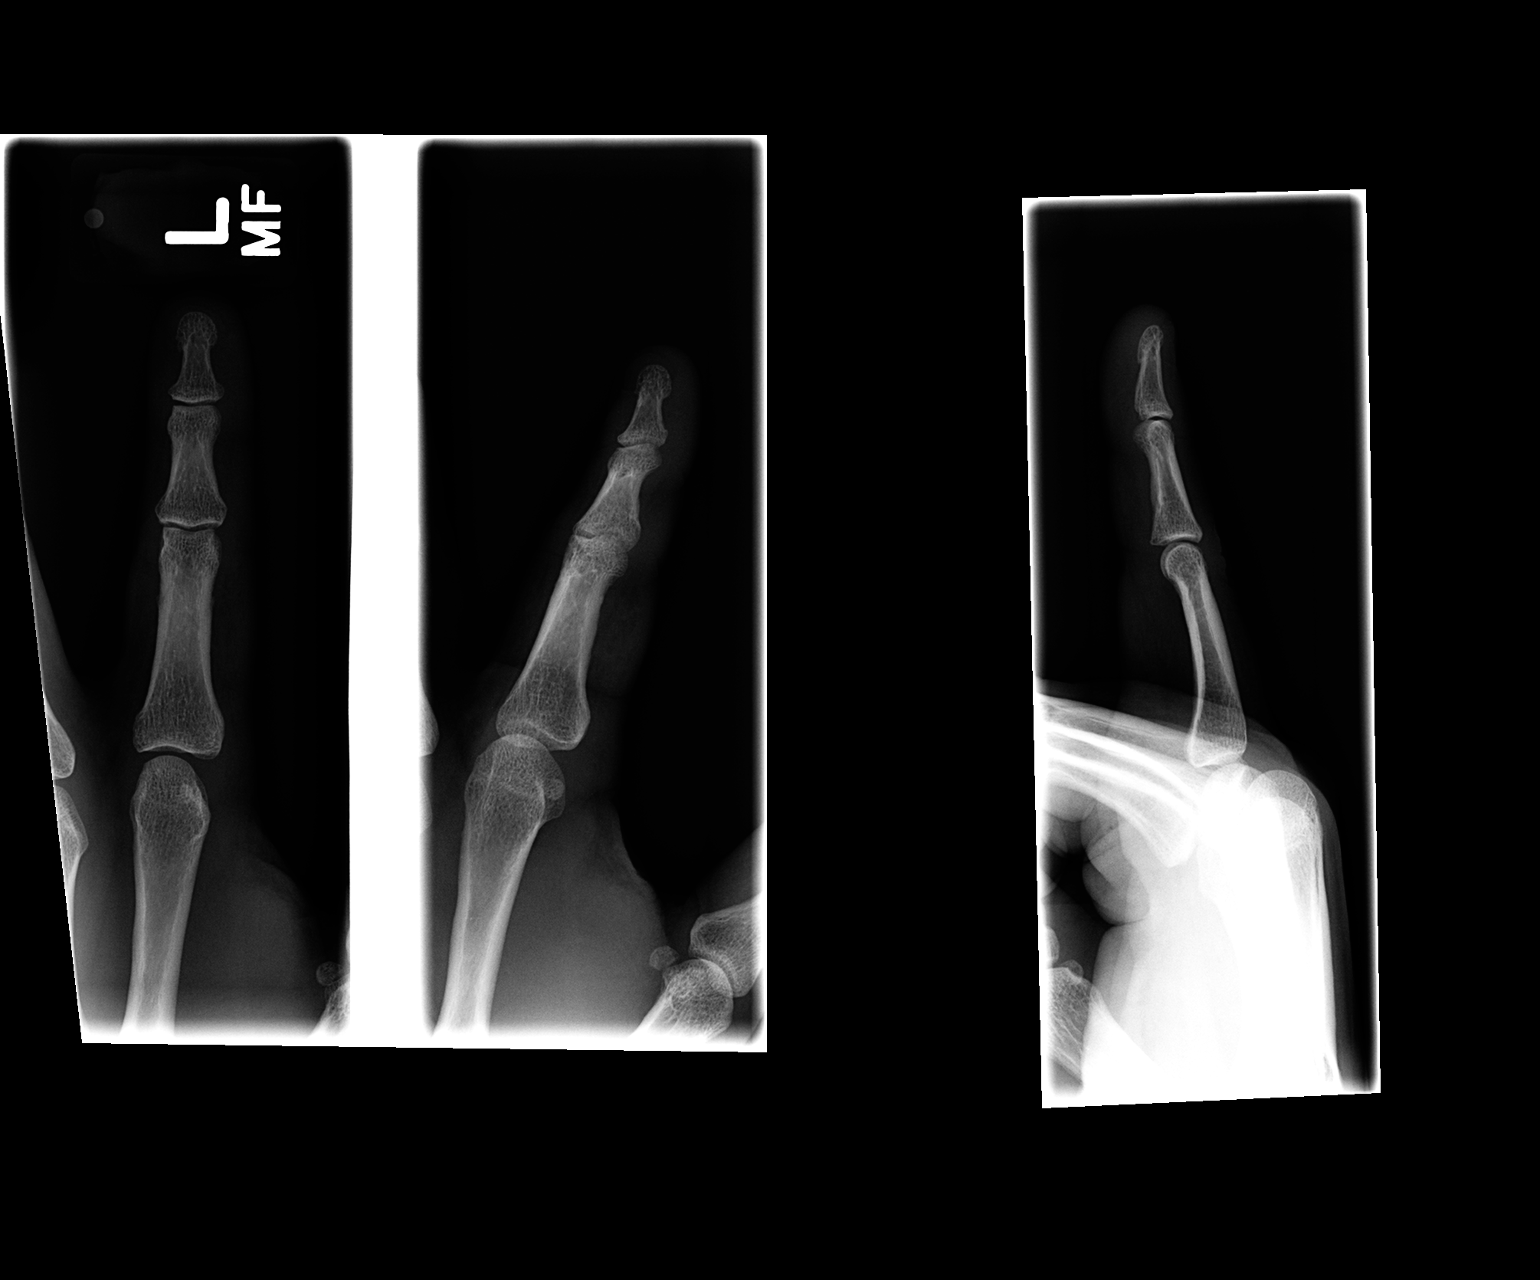

[1 of 1 positions shown; findings below may reference images not displayed]

FINDINGS: No fracture, foreign body, or acute bony findings are
identified.
IMPRESSION: No significant abnormality identified.

## 2013-11-29 ENCOUNTER — Institutional Professional Consult (permissible substitution): Payer: 59 | Admitting: Pulmonary Disease

## 2013-12-05 ENCOUNTER — Encounter: Payer: Self-pay | Admitting: Pulmonary Disease

## 2013-12-05 ENCOUNTER — Ambulatory Visit (INDEPENDENT_AMBULATORY_CARE_PROVIDER_SITE_OTHER): Payer: 59 | Admitting: Pulmonary Disease

## 2013-12-05 VITALS — BP 144/92 | HR 58 | Temp 97.5°F | Ht 70.5 in | Wt 258.0 lb

## 2013-12-05 DIAGNOSIS — G4733 Obstructive sleep apnea (adult) (pediatric): Secondary | ICD-10-CM | POA: Insufficient documentation

## 2013-12-05 NOTE — Assessment & Plan Note (Signed)
Patient's history is very suggestive of clinically significant sleep apnea. It had a long discussion with him and his wife about sleep apnea, including its impact to his quality of life and cardiovascular health. I think he needs to have a sleep study done for diagnosis, and he is an excellent candidate for home sleep testing. I have also encouraged him to work aggressively on weight loss.

## 2013-12-05 NOTE — Progress Notes (Signed)
Subjective:    Patient ID: Micheal [name], male    DOB: 10/24/1975, 38 y.o.   MRN: [id number]  HPI The patient is a 38 year old male who I've been asked to see for possible obstructive sleep apnea. He has been told that he has loud snoring, as well as witnessed apneas during sleep. He also notes choking arousals, and will awaken frequently with a swollen uvula. He has frequent awakenings most nights, and over 50% of the time is unrested upon arising. He notes significant inappropriate daytime sleepiness with periods of inactivity, and will usually increase his caffeine intake to accommodate. He will also fall asleep in the evenings watching television, and has sleepiness with driving. The patient's weight is down somewhat over the last 2 years, and his Epworth score today is abnormal at 14.   Sleep Questionnaire What time do you typically go to bed?( Between what hours) 12-3am OR 7-830a 12-3am OR 7-830a at 1055 on 12/05/13 by Maisie FusAshtyn M Green, CMA How long does it take you to fall asleep? instantly instantly at 1055 on 12/05/13 by Maisie FusAshtyn M Green, CMA How many times during the night do you wake up? 7 7 at 1055 on 12/05/13 by Maisie FusAshtyn M Green, CMA What time do you get out of bed to start your day? No Value 2-4pm w/work---9-11am when off at 1055 on 12/05/13 by Maisie FusAshtyn M Green, CMA Do you drive or operate heavy machinery in your occupation? No No at 1055 on 12/05/13 by Maisie FusAshtyn M Green, CMA How much has your weight changed (up or down) over the past two years? (In pounds) 15 lb (6.804 kg) 15 lb (6.804 kg) at 1055 on 12/05/13 by Maisie FusAshtyn M Green, CMA Have you ever had a sleep study before? No No at 1055 on 12/05/13 by Maisie FusAshtyn M Green, CMA Do you currently use CPAP? No No at 1055 on 12/05/13 by Maisie FusAshtyn M Green, CMA Do you wear oxygen at any time? No No at 1055 on 12/05/13 by Maisie FusAshtyn M Green, CMA   Review of Systems  Constitutional: Negative for fever and unexpected weight change.  HENT:  Positive for congestion and postnasal drip. Negative for dental problem, ear pain, nosebleeds, rhinorrhea, sinus pressure, sneezing, sore throat and trouble swallowing.        Ear popping  Eyes: Negative for redness and itching.  Respiratory: Negative for cough, chest tightness, shortness of breath and wheezing.   Cardiovascular: Negative for palpitations and leg swelling.  Gastrointestinal: Negative for nausea and vomiting.  Genitourinary: Negative for dysuria.  Musculoskeletal: Negative for joint swelling.  Skin: Negative for rash.  Neurological: Negative for headaches.  Hematological: Does not bruise/bleed easily.  Psychiatric/Behavioral: Negative for dysphoric mood. The patient is not nervous/anxious.        Objective:   Physical Exam Constitutional:   obese male, no acute distress  HENT:  Nares patent without discharge with mild septal deviation to the left.  Oropharynx without exudate, palate is elongated, and uvula is thick and swollen.   Eyes:  Perrla, eomi, no scleral icterus  Neck:  No JVD, no TMG  Cardiovascular:  Normal rate, regular rhythm, no rubs or gallops.  No murmurs        Intact distal pulses  Pulmonary :  Normal breath sounds, no stridor or respiratory distress   No rales, rhonchi, or wheezing  Abdominal:  Soft, nondistended, bowel sounds present.  No tenderness noted.   Musculoskeletal:  No lower extremity edema noted.  Lymph Nodes:  No cervical lymphadenopathy  noted  Skin:  No cyanosis noted  Neurologic:  Alert, appropriate, moves all 4 extremities without obvious deficit.         Assessment & Plan:

## 2013-12-05 NOTE — Patient Instructions (Signed)
Will schedule for home sleep testing, and will arrange followup once the results are available.   

## 2014-01-22 DIAGNOSIS — G4733 Obstructive sleep apnea (adult) (pediatric): Secondary | ICD-10-CM

## 2014-01-30 ENCOUNTER — Encounter: Payer: Self-pay | Admitting: Pulmonary Disease

## 2014-01-30 ENCOUNTER — Telehealth: Payer: Self-pay | Admitting: Pulmonary Disease

## 2014-01-30 DIAGNOSIS — G4733 Obstructive sleep apnea (adult) (pediatric): Secondary | ICD-10-CM

## 2014-01-30 NOTE — Telephone Encounter (Signed)
[email or web address].Jennifer Castillo, CMA  

## 2014-01-30 NOTE — Telephone Encounter (Signed)
Pt returned call.  Set an appt w/ KC for 6/5 @ 11 AM.  Pt states nothing further needed at this time. Antionette Fairy

## 2014-01-30 NOTE — Telephone Encounter (Signed)
Pt needs ov to review sleep study results.  

## 2014-01-31 ENCOUNTER — Ambulatory Visit (INDEPENDENT_AMBULATORY_CARE_PROVIDER_SITE_OTHER): Payer: 59 | Admitting: Pulmonary Disease

## 2014-01-31 ENCOUNTER — Encounter: Payer: Self-pay | Admitting: Pulmonary Disease

## 2014-01-31 VITALS — BP 124/72 | HR 55 | Temp 98.0°F | Ht 70.5 in | Wt 255.4 lb

## 2014-01-31 DIAGNOSIS — G4733 Obstructive sleep apnea (adult) (pediatric): Secondary | ICD-10-CM

## 2014-01-31 NOTE — Assessment & Plan Note (Signed)
The patient has mild OSA by his recent sleep study, and clearly has symptoms during the night and his waking hours. I have outlined a conservative approach with a trial of weight loss alone, versus more aggressive treatment with either surgery, dental appliance, or CPAP. The patient feels that he is symptomatic enough that he needs treatment while trying to work on weight reduction. He would prefer either a dental appliance or CPAP, and after further discussion he wishes to pursue dental consultation.

## 2014-01-31 NOTE — Progress Notes (Signed)
   Subjective:    Patient ID: Micheal [name], male    DOB: 07/12/1976, 38 y.o.   MRN: [id number]  HPI Patient comes in today for followup of his recent home sleep test. He was found to have mild OSA, with an AHI of 11 events per hour. I have reviewed the study with him in detail, and answered all of his questions   Review of Systems  Constitutional: Negative for fever and unexpected weight change.  HENT: Negative for congestion, dental problem, ear pain, nosebleeds, postnasal drip, rhinorrhea, sinus pressure, sneezing, sore throat and trouble swallowing.   Eyes: Negative for redness and itching.  Respiratory: Negative for cough, chest tightness, shortness of breath and wheezing.   Cardiovascular: Negative for palpitations and leg swelling.  Gastrointestinal: Negative for nausea and vomiting.  Genitourinary: Negative for dysuria.  Musculoskeletal: Negative for joint swelling.  Skin: Negative for rash.  Neurological: Negative for headaches.  Hematological: Does not bruise/bleed easily.  Psychiatric/Behavioral: Negative for dysphoric mood. The patient is not nervous/anxious.        Objective:   Physical Exam Overweight male in no acute distress Nose without purulence or discharge noted Neck without lymphadenopathy or thyromegaly Lower extremities without edema, no cyanosis Alert and oriented, moves all 4 extremities.       Assessment & Plan:

## 2014-01-31 NOTE — Patient Instructions (Signed)
Will refer to dental medicine to consider an oral appliance for treatment of your sleep apnea. Work on [employer] loss Let me know how things are going with the appliance, and call if you decide on cpap instead.

## 2014-03-03 ENCOUNTER — Telehealth: Payer: Self-pay | Admitting: Pulmonary Disease

## 2014-03-03 NOTE — Telephone Encounter (Signed)
They can give him an idea of cost without sending an order.  Last time I did this, they tried to set pt up on cpap without discussing with him first and it was a total mess.

## 2014-03-03 NOTE — Telephone Encounter (Signed)
Patient Instructions     Will refer to dental medicine to consider an oral appliance for treatment of your sleep apnea.  Work on [employer] loss  Let me know how things are going with the appliance, and call if you decide on cpap instead.     Spoke with patient- [employer] will not cover for patient to have an oral appliance and therefore patient would like to know the cost of a CPAP instead. Pt aware that I need to [facility] be aware that [facility] wants a [facility]AP so we can place an order to establish a DME company and they could help him with the cost information. Pt needs to know the cost of CPAP prior to excepting the machine. Pt is aware that we can place the order to a DME company with the understanding that they contact him with cost.   KC please advise.

## 2014-03-04 NOTE — Telephone Encounter (Signed)
Pt returned call & asks us to try BOTH phone #'s he left previously when returning the call, please.  Antionette FairyHolly D Pryor

## 2014-03-04 NOTE — Telephone Encounter (Signed)
lmomtcb x1 

## 2014-03-04 NOTE — Telephone Encounter (Signed)
Called spoke with patient and [facility] but we will try to find cost without placing the actual order.  Pt verified that he has [employer] insurance [employer].  He is aware that he will be contacted once we find something for him.  He requests to be called on his cell @ [phone].  PCC's, is there a way we find this information for him.  [facility] not covered by pt's insurance at all and he is required to pay 100% out of pocket.  He is interested in CPAP but needs to know the cost before he commits.    Thanks ladies!

## 2014-03-04 NOTE — Telephone Encounter (Signed)
Spoke to pt , i am having someone from [facility] to call him and talk to him about the cost of a new cpap set up and possibly his cost after ins payment Tobe SosSally E Ottinger

## 2014-04-09 ENCOUNTER — Telehealth: Payer: Self-pay | Admitting: Pulmonary Disease

## 2014-04-09 NOTE — Telephone Encounter (Signed)
Per the patient--was advised that [facility] going to be [facility] him to speak with him regarding insurance and cost of CPAP.  No one has contacted him as advised. Pt aware that We will speak with [facility].  Spoke with Dene GentrySherri [facility]--aware that Almyra FreeLibby spoke with someone back in July 2015 (03/04/14) and was advised that [facility] was going to be contacting the patient to speak with him regarding insurance and cost of CPAP. Sherri is going to look into this and call us back  Will await [facility] from [facility]

## 2014-04-11 NOTE — Telephone Encounter (Signed)
Almyra FreeLibby do you know anything about this? Carron CurieJennifer Castillo, CMA

## 2014-04-11 NOTE — Telephone Encounter (Signed)
Barbara CowerJason from [facility] responded to my message they have called pt today and left message on his phone hopefully this will be resolved once they a able to reach him Tobe SosSally E Ottinger

## 2014-04-11 NOTE — Telephone Encounter (Signed)
Sent staff message to [email or web address]  to get this taken care of Tobe SosSally E Ottinger

## 2014-08-05 ENCOUNTER — Ambulatory Visit (INDEPENDENT_AMBULATORY_CARE_PROVIDER_SITE_OTHER): Payer: 59 | Admitting: Internal Medicine

## 2014-08-05 ENCOUNTER — Telehealth: Payer: Self-pay | Admitting: Internal Medicine

## 2014-08-05 ENCOUNTER — Encounter: Payer: Self-pay | Admitting: Internal Medicine

## 2014-08-05 VITALS — BP 158/98 | HR 67 | Temp 97.9°F | Ht 72.0 in | Wt 258.0 lb

## 2014-08-05 DIAGNOSIS — E039 Hypothyroidism, unspecified: Secondary | ICD-10-CM

## 2014-08-05 DIAGNOSIS — Z0000 Encounter for general adult medical examination without abnormal findings: Secondary | ICD-10-CM | POA: Insufficient documentation

## 2014-08-05 DIAGNOSIS — G4733 Obstructive sleep apnea (adult) (pediatric): Secondary | ICD-10-CM

## 2014-08-05 MED ORDER — LEVOTHYROXINE SODIUM 50 MCG PO TABS: 50.0000 ug | ORAL_TABLET | Freq: Every day | ORAL | Status: DC

## 2014-08-05 NOTE — Telephone Encounter (Signed)
Spoke with patient and advised results  Lab appt scheduled  

## 2014-08-05 NOTE — Patient Instructions (Signed)
You can try a fiber supplement to see if that helps your bowels.  DASH Eating Plan DASH stands for "Dietary Approaches to Stop Hypertension." The DASH eating plan is a healthy eating plan that has been shown to reduce high blood pressure (hypertension). Additional health benefits may include reducing the risk of type 2 diabetes mellitus, heart disease, and stroke. The DASH eating plan may also help with weight loss. WHAT DO I NEED TO KNOW ABOUT THE DASH EATING PLAN? For the DASH eating plan, you will follow these general guidelines:  Choose foods with a percent daily value for sodium of less than 5% (as listed on the food label).  Use salt-free seasonings or herbs instead of table salt or sea salt.  Check with your health care provider or pharmacist before using salt substitutes.  Eat lower-sodium products, often labeled as "lower sodium" or "no salt added."  Eat fresh foods.  Eat more vegetables, fruits, and low-fat dairy products.  Choose whole grains. Look for the word "whole" as the first word in the ingredient list.  Choose fish and skinless chicken or [place] more often than red meat. Limit fish, poultry, and meat to 6 oz (170 g) each day.  Limit sweets, desserts, sugars, and sugary drinks.  Choose heart-healthy fats.  Limit cheese to 1 oz (28 g) per day.  Eat more home-cooked food and less restaurant, buffet, and fast food.  Limit fried foods.  Cook foods using methods other than frying.  Limit canned vegetables. If you do use them, rinse them well to decrease the sodium.  When eating at a restaurant, ask that your food be prepared with less salt, or no salt if possible. WHAT FOODS CAN I EAT? Seek help from a dietitian for individual calorie needs. Grains Whole grain or whole wheat bread. Brown rice. Whole grain or whole wheat pasta. Quinoa, bulgur, and whole grain cereals. Low-sodium cereals. Corn or whole wheat flour tortillas. Whole grain cornbread. Whole grain  crackers. Low-sodium crackers. Vegetables Fresh or frozen vegetables (raw, steamed, roasted, or grilled). Low-sodium or reduced-sodium tomato and vegetable juices. Low-sodium or reduced-sodium tomato sauce and paste. Low-sodium or reduced-sodium canned vegetables.  Fruits All fresh, canned (in natural juice), or frozen fruits. Meat and Other Protein Products Ground beef (85% or leaner), grass-fed beef, or beef trimmed of fat. Skinless chicken or [place]. Ground chicken or [place]. Pork trimmed of fat. All fish and seafood. Eggs. Dried beans, peas, or lentils. Unsalted nuts and seeds. Unsalted canned beans. Dairy Low-fat dairy products, such as skim or 1% milk, 2% or reduced-fat cheeses, low-fat ricotta or cottage cheese, or plain low-fat yogurt. Low-sodium or reduced-sodium cheeses. Fats and Oils Tub margarines without trans fats. Light or reduced-fat mayonnaise and salad dressings (reduced sodium). Avocado. Safflower, olive, or canola oils. Natural peanut or almond butter. Other Unsalted popcorn and pretzels. The items listed above may not be a complete list of recommended foods or beverages. Contact your dietitian for more options. WHAT FOODS ARE NOT RECOMMENDED? Grains White bread. White pasta. White rice. Refined cornbread. Bagels and croissants. Crackers that contain trans fat. Vegetables Creamed or fried vegetables. Vegetables in a cheese sauce. Regular canned vegetables. Regular canned tomato sauce and paste. Regular tomato and vegetable juices. Fruits Dried fruits. Canned fruit in light or heavy syrup. Fruit juice. Meat and Other Protein Products Fatty cuts of meat. Ribs, chicken wings, bacon, sausage, bologna, salami, chitterlings, fatback, hot dogs, bratwurst, and packaged luncheon meats. Salted nuts and seeds. Canned beans with salt. Dairy  Whole or 2% milk, cream, half-and-half, and cream cheese. Whole-fat or sweetened yogurt. Full-fat cheeses or blue cheese. Nondairy creamers and  whipped toppings. Processed cheese, cheese spreads, or cheese curds. Condiments Onion and garlic salt, seasoned salt, table salt, and sea salt. Canned and packaged gravies. Worcestershire sauce. Tartar sauce. Barbecue sauce. Teriyaki sauce. Soy sauce, including reduced sodium. Steak sauce. Fish sauce. Oyster sauce. Cocktail sauce. Horseradish. Ketchup and mustard. Meat flavorings and tenderizers. Bouillon cubes. Hot sauce. Tabasco sauce. Marinades. Taco seasonings. Relishes. Fats and Oils Butter, stick margarine, lard, shortening, ghee, and bacon fat. Coconut, palm kernel, or palm oils. Regular salad dressings. Other Pickles and olives. Salted popcorn and pretzels. The items listed above may not be a complete list of foods and beverages to avoid. Contact your dietitian for more information. WHERE CAN I FIND MORE INFORMATION? National Heart, Lung, and Blood Institute: travelstabloid.com Document Released: 08/04/2011 Document Revised: 12/30/2013 Document Reviewed: 06/19/2013 [facility] Patient Information 2015 [place], [place]. This information is not intended to replace advice given to you by your health care provider. Make sure you discuss any questions you have with your health care provider. Exercise to Lose Weight Exercise and a healthy diet may help you lose weight. Your doctor may suggest specific exercises. EXERCISE IDEAS AND TIPS  Choose low-cost things you enjoy doing, such as walking, bicycling, or exercising to workout videos.  Take stairs instead of the elevator.  Walk during your lunch break.  Park your car further away from work or school.  Go to a gym or an exercise class.  Start with 5 to 10 minutes of exercise each day. Build up to 30 minutes of exercise 4 to 6 days a week.  Wear shoes with good support and comfortable clothes.  Stretch before and after working out.  Work out until you breathe harder and your heart beats faster.  Drink  extra water when you exercise.  Do not do so much that you hurt yourself, feel dizzy, or get very short of breath. Exercises that burn about 150 calories:  Running 1  miles in 15 minutes.  Playing volleyball for 45 to 60 minutes.  Washing and waxing a car for 45 to 60 minutes.  Playing touch football for 45 minutes.  Walking 1  miles in 35 minutes.  Pushing a stroller 1  miles in 30 minutes.  Playing basketball for 30 minutes.  Raking leaves for 30 minutes.  Bicycling 5 miles in 30 minutes.  Walking 2 miles in 30 minutes.  Dancing for 30 minutes.  Shoveling snow for 15 minutes.  Swimming laps for 20 minutes.  Walking up stairs for 15 minutes.  Bicycling 4 miles in 15 minutes.  Gardening for 30 to 45 minutes.  Jumping rope for 15 minutes.  Washing windows or floors for 45 to 60 minutes. Document Released: 09/17/2010 Document Revised: 11/07/2011 Document Reviewed: 09/17/2010 [facility] Patient Information 2015 [place], [place]. This information is not intended to replace advice given to you by your health care provider. Make sure you discuss any questions you have with your health care provider.

## 2014-08-05 NOTE — Telephone Encounter (Signed)
Pt wanted to know if/when he needs to come back to check his thyroid levels again. He feels that it should be done before next year.

## 2014-08-05 NOTE — Assessment & Plan Note (Signed)
Under treated with high TSH Will increase to 

## 2014-08-05 NOTE — Assessment & Plan Note (Signed)
Generally healthy but needs to work on fitness UTD on immunizations

## 2014-08-05 NOTE — Progress Notes (Signed)
Subjective:    Patient ID: Micheal FriedlanderAlfred C [name], male    DOB: 11/20/1975, 38 y.o.   MRN: [id number]  HPI Here to transfer care--- Dr Alwyn RenHopper retired. Will do physical Reviewed his records  Diagnosis of hypothyroidism--seems to be subclinical Inconsistent with the medicine Averages taking it about half the time Recent TSH up to 13.39  Went to dentist Appliance too expensive Discussed again with Dr Shelle Ironlance--- trying to adjust position and snoring is better Working on fitness and trying to drop some weight  Current Outpatient Prescriptions on File Prior to Visit  Medication Sig Dispense Refill  . levothyroxine (SYNTHROID, LEVOTHROID) 25 MCG tablet Take 1 1/2 tabs once daily 135 tablet 1   No current facility-administered medications on file prior to visit.    Allergies  Allergen Reactions  . Calcium Channel Blockers     Chest discomfort; negative EKG  . Hctz [Hydrochlorothiazide]     cramps    Past Medical History  Diagnosis Date  . Gilbert syndrome 2005    total bilirubin  1.8  . Nonspecific elevation of levels of transaminase or lactic acid dehydrogenase (LDH) 2006     ALT 51  . Hypothyroidism 2005    free T4 0.8; TSH 5.77    Past Surgical History  Procedure Laterality Date  . Wisdom tooth extraction      as teen    Family History  Problem Relation Age of Onset  . Lung cancer Paternal Grandfather     smoker  . Lung cancer Paternal Grandmother     2nd hand smoke exposure   . Lung cancer Maternal Grandmother     smoker  . Transient ischemic attack Maternal Grandmother   . Hypertension Mother   . Pancreatic cancer Maternal Grandfather   . Diabetes      GGM  . Heart disease Neg Hx   . Deep vein thrombosis Sister 18    BCP & smoker    History   Social History  . Marital Status: Married    Spouse Name: N/A    Number of Children: 4  . Years of Education: N/A   Occupational History  . 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 Smoking status: Never Smoker   . Smokeless tobacco: Never Used  . Alcohol Use: Yes     Comment: Rarely  . Drug Use: No  . Sexual Activity: Not on file   Other Topics Concern  . Not on file   Social History Narrative   1 son from previous marriage   2 stepchildren   1 son from current marriage   Review of Systems  Constitutional: Negative for fatigue.       Wears seat belt  HENT: Negative for dental problem, hearing loss and tinnitus.        Regular with dentist  Eyes: Negative for visual disturbance.       No diplopia or unilateral vision loss  Respiratory: Negative for cough, chest tightness and shortness of breath.   Cardiovascular: Negative for chest pain, palpitations and leg swelling.  Gastrointestinal: Negative for nausea, vomiting and abdominal pain.       Tends to have loose and more frequent stools---gets urgency No heartburn  Endocrine: Positive for polydipsia and polyuria.       Rare episodes of increased thirst and urination  Genitourinary: Negative for urgency, frequency and difficulty urinating.       No sexual problems  Musculoskeletal:  Positive for neck pain. Negative for back pain and arthralgias.       Still gets regular cramps  Skin: Negative for rash.       No suspicious lesions  Allergic/Immunologic: Negative for environmental allergies and immunocompromised state.  Neurological: Positive for headaches. Negative for dizziness, syncope, weakness, light-headedness and numbness.       Rare headaches  Hematological: Negative for adenopathy. Does not bruise/bleed easily.  Psychiatric/Behavioral: Positive for sleep disturbance. Negative for dysphoric mood. The patient is not nervous/anxious.        Objective:   Physical Exam  Constitutional: He is oriented to person, place, and time. He appears well-developed and well-nourished. No distress.  HENT:  Head: Normocephalic and atraumatic.  Right Ear: External ear normal.  Left Ear: External ear normal.    Mouth/Throat: Oropharynx is clear and moist. No oropharyngeal exudate.  Eyes: Conjunctivae and EOM are normal. Pupils are equal, round, and reactive to light.  Neck: Normal range of motion. Neck supple.  Mild symmetric thyroid enlargement  Cardiovascular: Normal rate, regular rhythm, normal heart sounds and intact distal pulses.  Exam reveals no gallop.   No murmur heard. Pulmonary/Chest: Effort normal and breath sounds normal. No respiratory distress. He has no wheezes. He has no rales.  Abdominal: Soft. There is no tenderness.  Musculoskeletal: He exhibits no edema or tenderness.  Lymphadenopathy:    He has no cervical adenopathy.  Neurological: He is alert and oriented to person, place, and time.  Skin: No rash noted. No erythema.  Psychiatric: He has a normal mood and affect. His behavior is normal.          Assessment & Plan:

## 2014-08-05 NOTE — Telephone Encounter (Signed)
That makes sense You can set him up for TSH and free T4 in 2-3 months (hypothyroidism)

## 2014-08-05 NOTE — Assessment & Plan Note (Signed)
He will try weight loss 

## 2014-08-05 NOTE — Progress Notes (Signed)
Pre visit review using our clinic review tool, if applicable. No additional management support is needed unless otherwise documented below in the visit note. 

## 2014-08-18 ENCOUNTER — Telehealth: Payer: Self-pay

## 2014-08-18 NOTE — Telephone Encounter (Signed)
I generally don't recommend the patch since the incidence of side effects is fairly high If he has taken it before and did well--okay to fill enough for cruise length-- 1 patch every 3 days  I think most people are better off with meclizine 25mg   1 tid prn  Okay to send Rx for #40 x 0 if he prefers this

## 2014-08-18 NOTE — Telephone Encounter (Signed)
Pt left v/m; pt was seen 08/05/14; pt is going on cruise and request patch to prevent motion sickness; [facility] outpt pharmacy. Pt request cb.

## 2014-08-19 MED ORDER — MECLIZINE HCL 25 MG PO TABS: 25.0000 mg | ORAL_TABLET | Freq: Three times a day (TID) | ORAL | Status: DC | PRN

## 2014-08-19 NOTE — Telephone Encounter (Signed)
Spoke with patient and advised results rx sent to pharmacy by e-script  

## 2014-08-19 NOTE — Telephone Encounter (Signed)
.  left message to have patient return my call.  

## 2014-09-01 ENCOUNTER — Encounter: Payer: Self-pay | Admitting: Internal Medicine

## 2014-11-05 ENCOUNTER — Other Ambulatory Visit: Payer: Self-pay | Admitting: Internal Medicine

## 2014-11-05 DIAGNOSIS — E038 Other specified hypothyroidism: Secondary | ICD-10-CM

## 2014-11-11 ENCOUNTER — Other Ambulatory Visit: Payer: 59

## 2014-11-20 ENCOUNTER — Ambulatory Visit (INDEPENDENT_AMBULATORY_CARE_PROVIDER_SITE_OTHER): Payer: 59 | Admitting: Primary Care

## 2014-11-20 ENCOUNTER — Encounter: Payer: Self-pay | Admitting: Primary Care

## 2014-11-20 VITALS — BP 138/90 | HR 76 | Temp 97.6°F | Ht 72.0 in | Wt 255.8 lb

## 2014-11-20 DIAGNOSIS — H6092 Unspecified otitis externa, left ear: Secondary | ICD-10-CM

## 2014-11-20 DIAGNOSIS — J029 Acute pharyngitis, unspecified: Secondary | ICD-10-CM

## 2014-11-20 DIAGNOSIS — H60509 Unspecified acute noninfective otitis externa, unspecified ear: Secondary | ICD-10-CM | POA: Insufficient documentation

## 2014-11-20 LAB — POCT RAPID STREP A (OFFICE): Rapid Strep A Screen: NEGATIVE

## 2014-11-20 MED ORDER — BENZONATATE 100 MG PO CAPS: 100.0000 mg | ORAL_CAPSULE | Freq: Three times a day (TID) | ORAL | Status: DC | PRN

## 2014-11-20 MED ORDER — NEOMYCIN-POLYMYXIN-HC 3.5-10000-1 OT SOLN: 3.0000 [drp] | Freq: Four times a day (QID) | OTIC | Status: DC

## 2014-11-20 NOTE — Progress Notes (Signed)
Subjective:    Patient ID: Micheal [name], male    DOB: 04/24/1976, 39 y.o.   MRN: [id number]  HPI  Micheal [name] is a 39 year old male who presents today with a chief complaint of sore thoat that has been present for 7 days without improvement but not worsening. He's also had symptoms of sinus congestion, postnasal drip, and rhinorrhea. He's taken ibuprofen, throat lozenges without relief. He initially thought his symptoms may be viral but due to the lingering pain he would like to be evaluated for a bacterial cause. Denies fevers, nausea, or ear pain, but felt an odd "sensation" to left ear yesterday.   Review of Systems  Constitutional: Negative for fever and chills.  HENT: Positive for postnasal drip, rhinorrhea and sinus pressure.   Respiratory: Negative for shortness of breath.   Cardiovascular: Negative for chest pain.  Gastrointestinal: Negative for nausea and vomiting.  Musculoskeletal: Negative for myalgias.  Neurological: Negative for headaches.  Hematological: Negative for adenopathy.       Past Medical History  Diagnosis Date  . Gilbert syndrome 2005    total bilirubin  1.8  . Nonspecific elevation of levels of transaminase or lactic acid dehydrogenase (LDH) 2006     ALT 51  . Hypothyroidism 2005    free T4 0.8; TSH 5.77    History   Social History  . Marital Status: Married    Spouse Name: N/A  . Number of Children: 4  . 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 Smoking status: Never Smoker   . Smokeless tobacco: Never Used  . Alcohol Use: Yes     Comment: Rarely  . Drug Use: No  . Sexual Activity: Not on file   Other Topics Concern  . Not on file   Social History Narrative   1 son from previous marriage   2 stepchildren   1 son from current marriage    Past Surgical History  Procedure Laterality Date  . Wisdom tooth extraction      as teen    Family History    Problem Relation Age of Onset  . Lung cancer Paternal Grandfather     smoker  . Lung cancer Paternal Grandmother     2nd hand smoke exposure   . Lung cancer Maternal Grandmother     smoker  . Transient ischemic attack Maternal Grandmother   . Hypertension Mother   . Pancreatic cancer Maternal Grandfather   . Diabetes      GGM  . Heart disease Neg Hx   . Deep vein thrombosis Sister 18    BCP & smoker    Allergies  Allergen Reactions  . Calcium Channel Blockers     Chest discomfort; negative EKG  . Hctz [Hydrochlorothiazide]     cramps    Current Outpatient Prescriptions on File Prior to Visit  Medication Sig Dispense Refill  . levothyroxine (SYNTHROID, LEVOTHROID) 50 MCG tablet Take 1 tablet (50 mcg total) by mouth daily. 90 tablet 3   No current facility-administered medications on file prior to visit.    BP 138/90 mmHg  Pulse 76  Temp(Src) 97.6 F (36.4 C) (Oral)  Ht 6' (1.829 m)  Wt 255 lb 12.8 oz (116.03 kg)  BMI 34.69 kg/m2  SpO2 98%    Objective:   Physical Exam  Constitutional: He is oriented to person, place, and time. He  appears well-developed.  HENT:  Right Ear: Tympanic membrane is not erythematous.  Left Ear: Tympanic membrane is not erythematous.  Mouth/Throat: Uvula is midline. No oropharyngeal exudate or posterior oropharyngeal erythema.  Tonsils 1+, otherwise mostly unremarkable. Left ear canal with moderate erythema.   Eyes: Conjunctivae are normal. Pupils are equal, round, and reactive to light.  Neck: Neck supple.  Cardiovascular: Normal rate and regular rhythm.   Pulmonary/Chest: Effort normal and breath sounds normal.  Lymphadenopathy:    He has no cervical adenopathy.  Neurological: He is alert and oriented to person, place, and time.  Skin: Skin is warm and dry.  Psychiatric: He has a normal mood and affect.          Assessment & Plan:  Rapid strep negative today. Sent for culture. I suspect this is likely a viral  pharyngitis due to lack of systemic symptoms and mostly unremarkable exam. Continue ibuprofen and lozenges. Salt water gargles. Follow up if no improvement early next week.

## 2014-11-20 NOTE — Assessment & Plan Note (Signed)
Moderate erythema to left canal. RX for Cortisporin gtts. Follow up as needed.

## 2014-11-20 NOTE — Patient Instructions (Signed)
Your Strep test was negative today. I will send this off for a culture to see if it grows any bacteria. Start Tessalon Pearls three times daily as needed for cough. Use ear drops four times daily for otitis externa. Continue to use Ibuprofen for pain. Try some chloraseptic spray for pain as well.  Pharyngitis Pharyngitis is redness, pain, and swelling (inflammation) of your pharynx.  CAUSES  Pharyngitis is usually caused by infection. Most of the time, these infections are from viruses (viral) and are part of a cold. However, sometimes pharyngitis is caused by bacteria (bacterial). Pharyngitis can also be caused by allergies. Viral pharyngitis may be spread from person to person by coughing, sneezing, and personal items or utensils (cups, forks, spoons, toothbrushes). Bacterial pharyngitis may be spread from person to person by more intimate contact, such as kissing.  SIGNS AND SYMPTOMS  Symptoms of pharyngitis include:   Sore throat.   Tiredness (fatigue).   Low-grade fever.   Headache.  Joint pain and muscle aches.  Skin rashes.  Swollen lymph nodes.  Plaque-like film on throat or tonsils (often seen with bacterial pharyngitis). DIAGNOSIS  Your health care provider will ask you questions about your illness and your symptoms. Your medical history, along with a physical exam, is often all that is needed to diagnose pharyngitis. Sometimes, a rapid strep test is done. Other lab tests may also be done, depending on the suspected cause.  TREATMENT  Viral pharyngitis will usually get better in 3-4 days without the use of medicine. Bacterial pharyngitis is treated with medicines that kill germs (antibiotics).  HOME CARE INSTRUCTIONS   Drink enough water and fluids to keep your urine clear or pale yellow.   Only take over-the-counter or prescription medicines as directed by your health care provider:   If you are prescribed antibiotics, make sure you finish them even if you start  to feel better.   Do not take aspirin.   Get lots of rest.   Gargle with 8 oz of salt water ( tsp of salt per 1 qt of water) as often as every 1-2 hours to soothe your throat.   Throat lozenges (if you are not at risk for choking) or sprays may be used to soothe your throat. SEEK MEDICAL CARE IF:   You have large, tender lumps in your neck.  You have a rash.  You cough up green, yellow-brown, or bloody spit. SEEK IMMEDIATE MEDICAL CARE IF:   Your neck becomes stiff.  You drool or are unable to swallow liquids.  You vomit or are unable to keep medicines or liquids down.  You have severe pain that does not go away with the use of recommended medicines.  You have trouble breathing (not caused by a stuffy nose). MAKE SURE YOU:   Understand these instructions.  Will watch your condition.  Will get help right away if you are not doing well or get worse. Document Released: 08/15/2005 Document Revised: 06/05/2013 Document Reviewed: 04/22/2013 [facility] Patient Information 2015 [place]. This information is not intended to replace advice given to you by your health care provider. Make sure you discuss any questions you have with your health care provider.

## 2014-11-22 ENCOUNTER — Encounter: Payer: Self-pay | Admitting: Primary Care

## 2014-11-22 LAB — CULTURE, GROUP A STREP: ORGANISM ID, BACTERIA: NORMAL

## 2014-11-24 ENCOUNTER — Telehealth: Payer: Self-pay | Admitting: Pulmonary Disease

## 2014-11-24 NOTE — Telephone Encounter (Signed)
Spoke with the pt  He states that after putting it off for a while, has decided he does want to go ahead and get set up on CPAP  KC, please advise thanks

## 2014-11-26 ENCOUNTER — Other Ambulatory Visit: Payer: Self-pay | Admitting: Pulmonary Disease

## 2014-11-26 ENCOUNTER — Telehealth: Payer: Self-pay

## 2014-11-26 DIAGNOSIS — J029 Acute pharyngitis, unspecified: Secondary | ICD-10-CM

## 2014-11-26 DIAGNOSIS — G4733 Obstructive sleep apnea (adult) (pediatric): Secondary | ICD-10-CM

## 2014-11-26 MED ORDER — AMOXICILLIN 500 MG PO CAPS: 500.0000 mg | ORAL_CAPSULE | Freq: Two times a day (BID) | ORAL | Status: DC

## 2014-11-26 NOTE — Telephone Encounter (Signed)
Pt left v/m and has not received response to 11/22/14 email; pt was seen 11/20/14 and now pt's sorethroat has worsened and pt wants to know what is next step to relieve S/T. Pt request cb.

## 2014-11-26 NOTE — Telephone Encounter (Signed)
I left patient a My Chart message yesterday and never received a reply Spoke with Mr. Micheal [name] this afternoon and his throat has worsened. This has now been going on for 14+ days. Due to duration and worsening of symptoms, called in Amoxicillin BID for 10 days. He is to call me if no improvement in 3-4 days. He verbalized understating.

## 2014-11-26 NOTE — Telephone Encounter (Signed)
Let him know an order has been sent.  Should be hearing from someone soon

## 2014-11-27 NOTE — Telephone Encounter (Signed)
Spoke with pt, he is aware that a cpap order has been placed on his behalf.  Nothing further needed.

## 2015-02-02 ENCOUNTER — Encounter: Payer: Self-pay | Admitting: Pulmonary Disease

## 2015-02-02 ENCOUNTER — Ambulatory Visit (INDEPENDENT_AMBULATORY_CARE_PROVIDER_SITE_OTHER): Payer: 59 | Admitting: Pulmonary Disease

## 2015-02-02 VITALS — BP 124/74 | HR 67 | Temp 98.3°F | Ht 71.0 in | Wt 254.4 lb

## 2015-02-02 DIAGNOSIS — G4733 Obstructive sleep apnea (adult) (pediatric): Secondary | ICD-10-CM | POA: Diagnosis not present

## 2015-02-02 NOTE — Progress Notes (Signed)
   Subjective:    Patient ID: Micheal [name], male    DOB: 12/12/1975, 39 y.o.   MRN: [id number]  HPI The patient comes in today for follow-up of his obstructive sleep apnea. He is wearing C Pap compliantly by his download, with good control of his AHI. He is sleeping much better with the device, with greatly improved daytime alertness. However, sometimes he feels the pressure is not adequate on occasional nights.   Review of Systems  Constitutional: Negative for fever and unexpected weight change.  HENT: Negative for congestion, dental problem, ear pain, nosebleeds, postnasal drip, rhinorrhea, sinus pressure, sneezing, sore throat and trouble swallowing.   Eyes: Negative for redness and itching.  Respiratory: Negative for cough, chest tightness, shortness of breath and wheezing.   Cardiovascular: Negative for palpitations and leg swelling.  Gastrointestinal: Negative for nausea and vomiting.  Genitourinary: Negative for dysuria.  Musculoskeletal: Negative for joint swelling.  Skin: Negative for rash.  Neurological: Negative for headaches.  Hematological: Does not bruise/bleed easily.  Psychiatric/Behavioral: Negative for dysphoric mood. The patient is not nervous/anxious.        Objective:   Physical Exam Overweight male in no acute distress Nose without purulence or discharge noted Neck without lymphadenopathy or thyromegaly No skin breakdown or pressure necrosis from the C Pap mask Lower extremities without edema, no cyanosis Alert and oriented, moves all 4 extremities.       Assessment & Plan:

## 2015-02-02 NOTE — Assessment & Plan Note (Signed)
The patient feels that he is doing very well with his C Pap, although sometimes he feels the pressure may not be enough. I have reviewed his download, and his AHI is very well controlled. His maximum pressure does sometimes hit the limit of his pressure range, and therefore I will will widen the range on his device. I've also encouraged him to work aggressively on weight loss.

## 2015-02-02 NOTE — Patient Instructions (Signed)
Continue on cpap, and I will send an order to your home care company to widen the range on your auto setting.  Work on weight loss followup with Dr. Craige CottaSood in one year.

## 2015-03-23 ENCOUNTER — Telehealth: Payer: Self-pay | Admitting: Internal Medicine

## 2015-03-23 NOTE — Telephone Encounter (Signed)
Pt needs to schedule an appt with Dr. Alphonsus Sias first.

## 2015-03-23 NOTE — Telephone Encounter (Signed)
Pt is having trouble hearing when in crowds.  Should he got directly to hearing specialist or see pcp first? He wants to know if Dr Alphonsus Sias will think it is bad enough to needa specialist? Call back number is cell number, thanks/

## 2015-03-24 NOTE — Telephone Encounter (Signed)
Scheduled for 8/3, pt aware

## 2015-04-01 ENCOUNTER — Ambulatory Visit: Payer: 59 | Admitting: Internal Medicine

## 2015-04-01 ENCOUNTER — Telehealth: Payer: Self-pay | Admitting: Internal Medicine

## 2015-04-01 NOTE — Telephone Encounter (Signed)
Patient did not come in for their appointment today for hearing problems.  Please let me know if patient needs to be contacted immediately for follow up or no follow up needed.

## 2015-04-02 NOTE — Telephone Encounter (Signed)
Only reschedule if he feels he needs to be seen

## 2015-04-06 NOTE — Telephone Encounter (Signed)
Scheduled for 8/10. Pt aware

## 2015-04-08 ENCOUNTER — Encounter: Payer: Self-pay | Admitting: Internal Medicine

## 2015-04-08 ENCOUNTER — Ambulatory Visit (INDEPENDENT_AMBULATORY_CARE_PROVIDER_SITE_OTHER): Payer: 59 | Admitting: Internal Medicine

## 2015-04-08 VITALS — BP 140/88 | HR 63 | Temp 98.1°F | Wt 258.0 lb

## 2015-04-08 DIAGNOSIS — H9190 Unspecified hearing loss, unspecified ear: Secondary | ICD-10-CM

## 2015-04-08 NOTE — Progress Notes (Signed)
   Subjective:    Patient ID: Micheal [name], male    DOB: April 13, 1976, 39 y.o.   MRN: [id number]  HPI Here due to hearing problems  ??he is not sure whether there is a problem Does have some trouble hearing voices with background noise Some trouble with TV--- uses closed captioning at times (so he doesn't need it loud with kids sleeping)  Wife notes some trouble No recent change  OM on right with perforation Weird noise has resolved long ago Uses ear buds but not loud  Still inconsistent with his meds Schedule changes  Discussed taking it when he puts on CPAP Notes that he has ongoing concentration symptoms--- past Rx for ADHD Current Outpatient Prescriptions on File Prior to Visit  Medication Sig Dispense Refill  . levothyroxine (SYNTHROID, LEVOTHROID) 50 MCG tablet Take 1 tablet (50 mcg total) by mouth daily. 90 tablet 3   No current facility-administered medications on file prior to visit.    Allergies  Allergen Reactions  . Calcium Channel Blockers     Chest discomfort; negative EKG  . Hctz [Hydrochlorothiazide]     cramps    Past Medical History  Diagnosis Date  . Gilbert syndrome 2005    total bilirubin  1.8  . Nonspecific elevation of levels of transaminase or lactic acid dehydrogenase (LDH) 2006     ALT 51  . Hypothyroidism 2005    free T4 0.8; TSH 5.77    Past Surgical History  Procedure Laterality Date  . Wisdom tooth extraction      as teen    Family History  Problem Relation Age of Onset  . Lung cancer Paternal Grandfather     smoker  . Lung cancer Paternal Grandmother     2nd hand smoke exposure   . Lung cancer Maternal Grandmother     smoker  . Transient ischemic attack Maternal Grandmother   . Hypertension Mother   . Pancreatic cancer Maternal Grandfather   . Diabetes      GGM  . Heart disease Neg Hx   . Deep vein thrombosis Sister 31    BCP & smoker    Social History   Social History  . Marital Status: Married    Spouse Name:  N/A  . Number of Children: 4  . Years of Education: N/A   Occupational History  . 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 Smoking status: Never Smoker   . Smokeless tobacco: Never Used  . Alcohol Use: Yes     Comment: Rarely  . Drug Use: No  . Sexual Activity: Not on file   Other Topics Concern  . Not on file   Social History Narrative   1 son from previous marriage   2 stepchildren   1 son from current marriage   Review of Systems Rare tinnitus No ear pain No vertigo    Objective:   Physical Exam  HENT:  Mouth/Throat: Oropharynx is clear and moist. No oropharyngeal exudate.  No tragal tenderness No cerumen TMs look fine  Mild nasal swelling          Assessment & Plan:

## 2015-04-08 NOTE — Progress Notes (Signed)
Pre visit review using our clinic review tool, if applicable. No additional management support is needed unless otherwise documented below in the visit note. 

## 2015-04-08 NOTE — Assessment & Plan Note (Signed)
This seems to be mild by testing and no major functional issues No apparent middle ear pathology or cerumen Will just observe Formal audiology if worsens

## 2015-06-23 ENCOUNTER — Other Ambulatory Visit: Payer: Self-pay | Admitting: *Deleted

## 2015-06-23 MED ORDER — MEBENDAZOLE 100 MG PO CHEW: 100.0000 mg | CHEWABLE_TABLET | Freq: Once | ORAL | Status: DC

## 2015-06-26 ENCOUNTER — Encounter: Payer: Self-pay | Admitting: Family Medicine

## 2015-06-26 ENCOUNTER — Ambulatory Visit (INDEPENDENT_AMBULATORY_CARE_PROVIDER_SITE_OTHER): Payer: 59 | Admitting: Family Medicine

## 2015-06-26 VITALS — BP 138/94 | HR 71 | Temp 98.3°F | Ht 71.0 in | Wt 265.2 lb

## 2015-06-26 DIAGNOSIS — H9201 Otalgia, right ear: Secondary | ICD-10-CM | POA: Insufficient documentation

## 2015-06-26 NOTE — Progress Notes (Signed)
   Subjective:  Patient ID: Micheal [name], male    DOB: 02/06/1976  Age: 39 y.o. MRN: [id number]  CC: Ear pain, right  HPI:  39 year old male presents to clinic today for an acute visit with complaints of right ear pain.  Right ear pain  Patient reports that he developed right ear pain earlier today.  He denies any other associated upper respiration symptoms.  No fever or chills.  No known sick contacts.  No exacerbating factors.  He has taken ibuprofen with relief in his pain.  He requests evaluation today as this is happened to him in the past and required an ER visit because it was over the weekend.  Social Hx   Social History   Social History  . Marital Status: Married    Spouse Name: N/A  . Number of Children: 4  . Years of Education: N/A   Occupational History  . 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 Smoking status: Never Smoker   . Smokeless tobacco: Never Used  . Alcohol Use: Yes     Comment: Rarely  . Drug Use: No  . Sexual Activity: Not Asked   Other Topics Concern  . None   Social History Narrative   1 son from previous marriage   2 stepchildren   1 son from current marriage   Review of Systems  Constitutional: Negative for fever and chills.  HENT: Positive for ear pain.    Objective:  BP 138/94 mmHg  Pulse 71  Temp(Src) 98.3 F (36.8 C) (Oral)  Ht 5\' 11"  (1.803 m)  Wt 265 lb 4 oz (120.317 kg)  BMI 37.01 kg/m2  SpO2 98%  BP/Weight 06/26/2015 04/08/2015 02/02/2015  Systolic BP 138 140 124  Diastolic BP 94 88 74  Wt. (Lbs) 265.25 258 254.4  BMI 37.01 36 35.5   Physical Exam  Constitutional: He appears well-developed and well-nourished. No distress.  HENT:  Head: Normocephalic and atraumatic.  Oropharynx clear. Left TM normal. Right TM with some evidence of scarring. No erythema or bulging.  Cardiovascular: Normal rate and regular rhythm.   Pulmonary/Chest: Effort normal. No respiratory distress.  He has no wheezes. He has no rales.  Neurological: He is alert.  Psychiatric: He has a normal mood and affect.  Vitals reviewed.  Assessment & Plan:   Problem List Items Addressed This Visit    Right ear pain - Primary    Unremarkable exam today. Advised supportive care and PRN tylenol/sudafed.        Follow-up: PRN  Everlene OtherJayce Jailon Schaible, DO

## 2015-06-26 NOTE — Assessment & Plan Note (Signed)
Unremarkable exam today. Advised supportive care and PRN tylenol/sudafed.

## 2015-06-26 NOTE — Patient Instructions (Signed)
Exam was normal.  Use pseudofed and ibuprofen.  Follow up closely with your PCP.  Take care  Dr. Adriana Simasook

## 2015-06-26 NOTE — Progress Notes (Signed)
Pre visit review using our clinic review tool, if applicable. No additional management support is needed unless otherwise documented below in the visit note. 

## 2015-07-15 ENCOUNTER — Ambulatory Visit (INDEPENDENT_AMBULATORY_CARE_PROVIDER_SITE_OTHER): Payer: 59 | Admitting: Psychology

## 2015-07-15 DIAGNOSIS — F902 Attention-deficit hyperactivity disorder, combined type: Secondary | ICD-10-CM

## 2015-07-15 DIAGNOSIS — F909 Attention-deficit hyperactivity disorder, unspecified type: Secondary | ICD-10-CM

## 2015-08-11 ENCOUNTER — Encounter: Payer: 59 | Admitting: Internal Medicine

## 2015-08-12 ENCOUNTER — Ambulatory Visit: Payer: 59 | Admitting: Psychology

## 2015-08-29 ENCOUNTER — Other Ambulatory Visit: Payer: Self-pay

## 2015-08-29 ENCOUNTER — Telehealth: Payer: Self-pay | Admitting: Family Medicine

## 2015-08-29 MED ORDER — GUAIFENESIN-CODEINE 100-10 MG/5ML PO SYRP: 5.0000 mL | ORAL_SOLUTION | Freq: Three times a day (TID) | ORAL | Status: DC | PRN

## 2015-08-29 NOTE — Telephone Encounter (Signed)
Seen with son at Saturday clinic but not formal office visit. 4d h/o head and chest congestion, cough, mild itchy eyes started today. No fevers. Likely viral.  [place] then [place].  Provided with codeine cough syrup which he has tolerated well in past. Extensive discussion on sedation precautions in setting of upcoming drive. Pt is EMT, works at [employer].

## 2015-08-29 NOTE — Telephone Encounter (Signed)
-----   Message from [facility] Cairrikier [place], [place] sent at 08/29/2015  1:42 PM EST ----- This is the dad of the 89 yr old.

## 2015-09-28 ENCOUNTER — Encounter: Payer: Self-pay | Admitting: Internal Medicine

## 2015-09-28 ENCOUNTER — Ambulatory Visit (INDEPENDENT_AMBULATORY_CARE_PROVIDER_SITE_OTHER): Payer: 59 | Admitting: Internal Medicine

## 2015-09-28 VITALS — BP 120/88 | HR 86 | Temp 98.0°F | Ht 71.0 in | Wt 265.0 lb

## 2015-09-28 DIAGNOSIS — Z0000 Encounter for general adult medical examination without abnormal findings: Secondary | ICD-10-CM

## 2015-09-28 DIAGNOSIS — G4733 Obstructive sleep apnea (adult) (pediatric): Secondary | ICD-10-CM | POA: Diagnosis not present

## 2015-09-28 DIAGNOSIS — E039 Hypothyroidism, unspecified: Secondary | ICD-10-CM

## 2015-09-28 NOTE — Progress Notes (Signed)
Pre visit review using our clinic review tool, if applicable. No additional management support is needed unless otherwise documented below in the visit note. 

## 2015-09-28 NOTE — Assessment & Plan Note (Signed)
Off meds due to side effects Thyroid is palpable If labs indicate-- will try armour thyroid instead

## 2015-09-28 NOTE — Progress Notes (Signed)
Subjective:    Patient ID: Micheal [name], male    DOB: [birth year], 40 y.o.   MRN: [id number]  HPI Here for physical  Concerned his weight Doing "weight loss challenge" with his 2 daughters Has cut back regular soda---was drinking a lot No exercise--but plans to start   Has CPAP machine At end of year--got unrelated cough and went out of country without the machine Cough then resolved Now just started using it again--and cough restarted Not sure if this is just coincidence Rare heartburn--- like once over several months  Has gone to a psychologist about compliance He stopped the levothyroxine--feels his cramps get worse Stopped ~2 months  No current outpatient prescriptions on file prior to visit.   No current facility-administered medications on file prior to visit.    Allergies  Allergen Reactions  . Calcium Channel Blockers     Chest discomfort; negative EKG  . Hctz [Hydrochlorothiazide]     cramps    Past Medical History  Diagnosis Date  . Gilbert syndrome 2005    total bilirubin  1.8  . Nonspecific elevation of levels of transaminase or lactic acid dehydrogenase (LDH) 2006     ALT 51  . Hypothyroidism 2005    free T4 0.8; TSH 5.77    Past Surgical History  Procedure Laterality Date  . Wisdom tooth extraction      as teen    Family History  Problem Relation Age of Onset  . Lung cancer Paternal Grandfather     smoker  . Lung cancer Paternal Grandmother     2nd hand smoke exposure   . Lung cancer Maternal Grandmother     smoker  . Transient ischemic attack Maternal Grandmother   . Hypertension Mother   . Pancreatic cancer Maternal Grandfather   . Diabetes      GGM  . Heart disease Neg Hx   . Deep vein thrombosis Sister 92    BCP & smoker    Social History   Social History  . Marital Status: Married    Spouse Name: N/A  . Number of Children: 4  . Years of Education: N/A   Occupational History  . 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 Smoking status: Never Smoker   . Smokeless tobacco: Never Used  . Alcohol Use: Yes     Comment: Rarely  . Drug Use: No  . Sexual Activity: Not on file   Other Topics Concern  . Not on file   Social History Narrative   1 son from previous marriage   2 stepchildren   1 son from current marriage   Review of Systems  Constitutional: Negative for fatigue and unexpected weight change.       Wears seat belt  HENT: Negative for dental problem, ear pain and hearing loss.        Keeps up with dentist  Eyes: Negative for visual disturbance.       No diplopia or unilateral vision loss Mild photosensitivity  Respiratory: Positive for cough. Negative for chest tightness and shortness of breath.   Cardiovascular: Positive for palpitations. Negative for chest pain and leg swelling.       Rare "odd" feeling  Gastrointestinal: Positive for blood in stool. Negative for nausea and abdominal pain.       IBS-diarrhea diagnosed in past. Has to rush to bathroom first thing in AM. May have to go 2 more times in  AM. No pain  Endocrine: Negative for polydipsia and polyuria.  Genitourinary: Negative for urgency and difficulty urinating.       No sexual problems but will get stomach cramps sometimes during sex  Musculoskeletal: Negative for back pain, joint swelling and arthralgias.       Still gets muscle twitches and cramps  Skin: Negative for rash.       New spot on right forearm--not suspicious Some "dots" between right toes  Allergic/Immunologic: Negative for environmental allergies and immunocompromised state.  Neurological: Negative for dizziness, syncope, weakness, light-headedness and headaches.  Hematological: Negative for adenopathy. Does not bruise/bleed easily.  Psychiatric/Behavioral: Negative for sleep disturbance and dysphoric mood. The patient is not nervous/anxious.        Sleep pattern off due to shift work--often 7P-7A       Objective:    Physical Exam  Constitutional: He is oriented to person, place, and time. He appears well-developed and well-nourished. No distress.  HENT:  Head: Normocephalic and atraumatic.  Right Ear: External ear normal.  Left Ear: External ear normal.  Mouth/Throat: Oropharynx is clear and moist. No oropharyngeal exudate.  Eyes: Conjunctivae and EOM are normal. Pupils are equal, round, and reactive to light.  Neck: Normal range of motion. Neck supple.  Mild diffusely enlarged thyroid  Cardiovascular: Normal rate, regular rhythm, normal heart sounds and intact distal pulses.  Exam reveals no gallop.   No murmur heard. Pulmonary/Chest: Effort normal and breath sounds normal. No respiratory distress. He has no wheezes. He has no rales.  Abdominal: Soft. There is no tenderness.  Musculoskeletal: He exhibits no edema or tenderness.  Lymphadenopathy:    He has no cervical adenopathy.  Neurological: He is alert and oriented to person, place, and time.  Skin: No rash noted. No erythema.  seb keratosis on right forearm  Psychiatric: He has a normal mood and affect. His behavior is normal.          Assessment & Plan:

## 2015-09-28 NOTE — Assessment & Plan Note (Signed)
Not sure if he is getting cough from machine Will try it again

## 2015-09-28 NOTE — Assessment & Plan Note (Signed)
Healthy but needs to work on fitness Had evaluation for ADHD--- will try to get this report He plans to join gym

## 2015-09-29 LAB — COMPREHENSIVE METABOLIC PANEL
ALT: 43 U/L (ref 0–53)
AST: 28 U/L (ref 0–37)
Albumin: 4.3 g/dL (ref 3.5–5.2)
Alkaline Phosphatase: 104 U/L (ref 39–117)
BUN: 14 mg/dL (ref 6–23)
CALCIUM: 9 mg/dL (ref 8.4–10.5)
CHLORIDE: 107 meq/L (ref 96–112)
CO2: 26 meq/L (ref 19–32)
CREATININE: 0.95 mg/dL (ref 0.40–1.50)
GFR: 93.14 mL/min (ref >60.00)
Glucose, Bld: 81 mg/dL (ref 70–99)
POTASSIUM: 4.1 meq/L (ref 3.5–5.1)
Sodium: 139 mEq/L (ref 135–145)
Total Bilirubin: 0.8 mg/dL (ref 0.2–1.2)
Total Protein: 6.9 g/dL (ref 6.0–8.3)

## 2015-09-29 LAB — CBC WITH DIFFERENTIAL/PLATELET
BASOS PCT: 0.4 % (ref 0.0–3.0)
Basophils Absolute: 0 10*3/uL (ref 0.0–0.1)
EOS PCT: 1.1 % (ref 0.0–5.0)
Eosinophils Absolute: 0.1 10*3/uL (ref 0.0–0.7)
HEMATOCRIT: 45.5 % (ref 39.0–52.0)
HEMOGLOBIN: 15.6 g/dL (ref 13.0–17.0)
LYMPHS PCT: 20.7 % (ref 12.0–46.0)
Lymphs Abs: 1.7 10*3/uL (ref 0.7–4.0)
MCHC: 34.1 g/dL (ref 30.0–36.0)
MCV: 89.2 fl (ref 78.0–100.0)
MONOS PCT: 10.6 % (ref 3.0–12.0)
Monocytes Absolute: 0.9 10*3/uL (ref 0.1–1.0)
NEUTROS ABS: 5.7 10*3/uL (ref 1.4–7.7)
Neutrophils Relative %: 67.2 % (ref 43.0–77.0)
PLATELETS: 171 10*3/uL (ref 150.0–400.0)
RBC: 5.11 Mil/uL (ref 4.22–5.81)
RDW: 14.4 % (ref 11.5–15.5)
WBC: 8.4 10*3/uL (ref 4.0–10.5)

## 2015-09-29 LAB — T4, FREE: Free T4: 0.79 ng/dL (ref 0.60–1.60)

## 2015-09-29 LAB — TSH: TSH: 12.66 u[IU]/mL — AB (ref 0.35–4.50)

## 2015-10-01 ENCOUNTER — Telehealth: Payer: Self-pay | Admitting: Internal Medicine

## 2015-10-01 ENCOUNTER — Encounter: Payer: Self-pay | Admitting: Internal Medicine

## 2015-10-01 DIAGNOSIS — F902 Attention-deficit hyperactivity disorder, combined type: Secondary | ICD-10-CM | POA: Insufficient documentation

## 2015-10-01 MED ORDER — METHYLPHENIDATE HCL ER (OSM) 18 MG PO TBCR: 18.0000 mg | EXTENDED_RELEASE_TABLET | Freq: Every day | ORAL | Status: DC

## 2015-10-01 NOTE — Telephone Encounter (Signed)
Reviewed Dr Altabet's report as well as spoke to him about ADHD briefly at his recent physical. Will go ahead with empiric Rx He will set up an appt in 1 month to review how he is doing. Will try ER methylphenidate since he tolerated this as child  Told him to take med holiday when not working and just leisure day  Make sure he gets follow up appt

## 2015-10-01 NOTE — Telephone Encounter (Signed)
Spoke with patient and advised rx ready for pick-up and it will be at the front desk. appt made for 1 mth f/u

## 2015-10-02 MED FILL — METHYLPHENIDATE ER 18 MG TA: 18 | 30 days supply | Qty: 60 | Fill #0

## 2015-10-10 DIAGNOSIS — G4733 Obstructive sleep apnea (adult) (pediatric): Secondary | ICD-10-CM | POA: Diagnosis not present

## 2015-10-14 MED FILL — CHERATUSSIN AC SYRUP: 100-10 | 9 days supply | Qty: 140 | Fill #0

## 2015-10-29 ENCOUNTER — Ambulatory Visit (INDEPENDENT_AMBULATORY_CARE_PROVIDER_SITE_OTHER): Payer: 59 | Admitting: Internal Medicine

## 2015-10-29 ENCOUNTER — Encounter: Payer: Self-pay | Admitting: Internal Medicine

## 2015-10-29 VITALS — BP 140/90 | HR 72 | Temp 97.3°F | Wt 248.8 lb

## 2015-10-29 DIAGNOSIS — E039 Hypothyroidism, unspecified: Secondary | ICD-10-CM

## 2015-10-29 DIAGNOSIS — F902 Attention-deficit hyperactivity disorder, combined type: Secondary | ICD-10-CM | POA: Diagnosis not present

## 2015-10-29 MED ORDER — METHYLPHENIDATE HCL ER (OSM) 18 MG PO TBCR: 18.0000 mg | EXTENDED_RELEASE_TABLET | Freq: Every day | ORAL | Status: DC

## 2015-10-29 NOTE — Progress Notes (Signed)
Pre visit review using our clinic review tool, if applicable. No additional management support is needed unless otherwise documented below in the visit note. 

## 2015-10-29 NOTE — Assessment & Plan Note (Signed)
Cramps are better so restarted the levothyroxine Asked him to take only since his free T4 was normal

## 2015-10-29 NOTE — Assessment & Plan Note (Signed)
Really has done well with it Able to perform at work better No side effects Now doesn't seek out food as much---really happy about losing the weight

## 2015-10-29 NOTE — Progress Notes (Signed)
   Subjective:    Patient ID: Micheal [name], male    DOB: 1976-06-26, 40 y.o.   MRN: [id number]  HPI Here for follow up of ADHD  Started the med He doesn't notice that much difference but has gotten some compliments at work Remembers to go back and finish things Wife notices a difference Has less appetite and has lost a fair bit of weight Takes 2 on work days--- usually just 1 on off days  Cramps are better Started the levothyroxine again--still no cramps  Current Outpatient Prescriptions on File Prior to Visit  Medication Sig Dispense Refill  . methylphenidate 18 MG PO CR tablet Take 1-2 tablets (18-36 mg total) by mouth daily. 60 tablet 0   No current facility-administered medications on file prior to visit.    Allergies  Allergen Reactions  . Calcium Channel Blockers     Chest discomfort; negative EKG  . Hctz [Hydrochlorothiazide]     cramps    Past Medical History  Diagnosis Date  . Gilbert syndrome 2005    total bilirubin  1.8  . Nonspecific elevation of levels of transaminase or lactic acid dehydrogenase (LDH) 2006     ALT 51  . Hypothyroidism 2005    free T4 0.8; TSH 5.77  . ADHD (attention deficit hyperactivity disorder), combined type     Past Surgical History  Procedure Laterality Date  . Wisdom tooth extraction      as teen    Family History  Problem Relation Age of Onset  . Lung cancer Paternal Grandfather     smoker  . Lung cancer Paternal Grandmother     2nd hand smoke exposure   . Lung cancer Maternal Grandmother     smoker  . Transient ischemic attack Maternal Grandmother   . Hypertension Mother   . Pancreatic cancer Maternal Grandfather   . Diabetes      GGM  . Heart disease Neg Hx   . Deep vein thrombosis Sister 74    BCP & smoker    Social History   Social History  . Marital Status: Married    Spouse Name: N/A  . Number of Children: 4  . Years of Education: N/A   Occupational History  . 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 Smoking status: Never Smoker   . Smokeless tobacco: Never Used  . Alcohol Use: Yes     Comment: Rarely  . Drug Use: No  . Sexual Activity: Not on file   Other Topics Concern  . Not on file   Social History Narrative   1 son from previous marriage   2 stepchildren   1 son from current marriage   Review of Systems  Sleeps well No GI symptoms     Objective:   Physical Exam  Constitutional: He appears well-developed and well-nourished. No distress.  Psychiatric: He has a normal mood and affect. His behavior is normal.          Assessment & Plan:

## 2015-11-11 MED FILL — METHYLPHENIDATE ER 18 MG TA: 18 | 30 days supply | Qty: 60 | Fill #0

## 2015-11-12 ENCOUNTER — Other Ambulatory Visit (INDEPENDENT_AMBULATORY_CARE_PROVIDER_SITE_OTHER): Payer: 59

## 2015-11-12 DIAGNOSIS — E039 Hypothyroidism, unspecified: Secondary | ICD-10-CM | POA: Diagnosis not present

## 2015-11-12 LAB — T4, FREE: FREE T4: 0.91 ng/dL (ref 0.60–1.60)

## 2015-11-12 LAB — TSH: TSH: 3.72 u[IU]/mL (ref 0.35–4.50)

## 2015-12-02 MED FILL — DOXYCYCLINE HYCLATE 100 MG: 100 | 14 days supply | Qty: 15 | Fill #0

## 2015-12-02 MED FILL — CHLORHEXIDINE 0.12% RINSE: 0.12 | 16 days supply | Qty: 473 | Fill #0

## 2015-12-16 ENCOUNTER — Ambulatory Visit (INDEPENDENT_AMBULATORY_CARE_PROVIDER_SITE_OTHER): Payer: 59 | Admitting: Internal Medicine

## 2015-12-16 ENCOUNTER — Encounter: Payer: Self-pay | Admitting: Internal Medicine

## 2015-12-16 VITALS — BP 132/92 | HR 72 | Temp 98.3°F | Wt 231.0 lb

## 2015-12-16 DIAGNOSIS — S239XXA Sprain of unspecified parts of thorax, initial encounter: Secondary | ICD-10-CM

## 2015-12-16 MED ORDER — METHYLPHENIDATE HCL ER (OSM) 18 MG PO TBCR: 18.0000 mg | EXTENDED_RELEASE_TABLET | Freq: Every day | ORAL | Status: DC

## 2015-12-16 MED FILL — METHYLPHENIDATE ER 18 MG TA: 18 | 30 days supply | Qty: 60 | Fill #0

## 2015-12-16 NOTE — Assessment & Plan Note (Signed)
Discussed that this is mild and should be self limited Discussed heat and tylenol before work Try to loosen up before his shift

## 2015-12-16 NOTE — Progress Notes (Signed)
   Subjective:    Patient ID: Micheal [name], male    DOB: 05/25/1976, 40 y.o.   MRN: [id number]  HPI Here with wife Having a sore "strip" in middle of his back---right side along spine (thoracic) Worsens as he does things Improves with rest and sleep Was trying to stretch, etc--and may have made it worse Feels it with cough or sneeze  Tylenol really helps Ibuprofen and naproxen not as helpful  Started about 4 weeks ago  Some left knee pain Goes back a long time Seems like a burning, numb type of pain Better with certain positions, etc (seems like lateral cutaneous nerve of thigh--discussed)  Current Outpatient Prescriptions on File Prior to Visit  Medication Sig Dispense Refill  . levothyroxine (SYNTHROID, LEVOTHROID) 50 MCG tablet Take 25 mcg by mouth daily before breakfast.    . methylphenidate 18 MG PO CR tablet Take 1-2 tablets (18-36 mg total) by mouth daily. 60 tablet 0   No current facility-administered medications on file prior to visit.    Allergies  Allergen Reactions  . Calcium Channel Blockers     Chest discomfort; negative EKG  . Hctz [Hydrochlorothiazide]     cramps    Past Medical History  Diagnosis Date  . Gilbert syndrome 2005    total bilirubin  1.8  . Nonspecific elevation of levels of transaminase or lactic acid dehydrogenase (LDH) 2006     ALT 51  . Hypothyroidism 2005    free T4 0.8; TSH 5.77  . ADHD (attention deficit hyperactivity disorder), combined type     Past Surgical History  Procedure Laterality Date  . Wisdom tooth extraction      as teen    Family History  Problem Relation Age of Onset  . Lung cancer Paternal Grandfather     smoker  . Lung cancer Paternal Grandmother     2nd hand smoke exposure   . Lung cancer Maternal Grandmother     smoker  . Transient ischemic attack Maternal Grandmother   . Hypertension Mother   . Pancreatic cancer Maternal Grandfather   . Diabetes      GGM  . Heart disease Neg Hx   . Deep  vein thrombosis Sister [id number]    BCP & smoker    Social History   Social History  . Marital Status: Married    Spouse Name: N/A  . Number of Children: 4  . 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 Smoking status: Never Smoker   . Smokeless tobacco: Never Used  . Alcohol Use: Yes     Comment: Rarely  . Drug Use: No  . Sexual Activity: Not on file   Other Topics Concern  . Not on file   Social History Narrative   1 son from previous marriage   2 stepchildren   1 son from current marriage   Review of Systems Working on weight loss in work program-- lost 17# in past 6 weeks No fever No cough or breathing problems    Objective:   Physical Exam  Musculoskeletal:  No spine tenderness Normal back flexion Mild tenderness along right thoracic paraspinals (~T6-10) Normal ROM in back          Assessment & Plan:

## 2015-12-16 NOTE — Progress Notes (Signed)
Pre visit review using our clinic review tool, if applicable. No additional management support is needed unless otherwise documented below in the visit note. 

## 2016-01-22 ENCOUNTER — Other Ambulatory Visit: Payer: Self-pay

## 2016-01-22 NOTE — Telephone Encounter (Signed)
Pt left v/m requesting rx for concerta. Call when ready for pick up. Pt last seen 10/29/15; rx last printed # 60 on 12/16/15.

## 2016-01-23 NOTE — Telephone Encounter (Signed)
Okay to prepare Rx for #30 x 0

## 2016-01-26 MED ORDER — METHYLPHENIDATE HCL ER (OSM) 18 MG PO TBCR: 18.0000 mg | EXTENDED_RELEASE_TABLET | Freq: Every day | ORAL | Status: DC

## 2016-01-26 NOTE — Telephone Encounter (Signed)
Rx printed and waiting on signature 

## 2016-01-26 NOTE — Telephone Encounter (Signed)
Spoke to pt. RX up front ready for pickup 

## 2016-01-27 MED FILL — METHYLPHENIDATE ER 18 MG TA: 18 | 30 days supply | Qty: 60 | Fill #0

## 2016-02-22 ENCOUNTER — Other Ambulatory Visit: Payer: Self-pay | Admitting: Occupational Medicine

## 2016-02-22 ENCOUNTER — Ambulatory Visit: Payer: Self-pay

## 2016-02-22 DIAGNOSIS — M79645 Pain in left finger(s): Secondary | ICD-10-CM

## 2016-02-29 ENCOUNTER — Encounter: Payer: Self-pay | Admitting: Internal Medicine

## 2016-02-29 MED ORDER — METHYLPHENIDATE HCL ER (OSM) 18 MG PO TBCR: 18.0000 mg | EXTENDED_RELEASE_TABLET | Freq: Every day | ORAL | Status: DC

## 2016-02-29 MED ORDER — LEVOTHYROXINE SODIUM 50 MCG PO TABS: 25.0000 ug | ORAL_TABLET | Freq: Every day | ORAL | Status: DC

## 2016-02-29 MED FILL — LEVOTHYROXINE 50 MCG TABLET: 50 | 90 days supply | Qty: 45 | Fill #0

## 2016-02-29 NOTE — Telephone Encounter (Signed)
MyChart Message sent ot pt that Methylphenidate is up front. Levothyroxine was sent to the pharmacy

## 2016-03-04 MED FILL — METHYLPHENIDATE ER 18 MG TA: 18 | 30 days supply | Qty: 60 | Fill #0

## 2016-03-08 ENCOUNTER — Encounter: Payer: Self-pay | Admitting: Pulmonary Disease

## 2016-03-08 ENCOUNTER — Ambulatory Visit (INDEPENDENT_AMBULATORY_CARE_PROVIDER_SITE_OTHER): Payer: 59 | Admitting: Pulmonary Disease

## 2016-03-08 VITALS — BP 124/74 | HR 70 | Ht 71.0 in | Wt 229.0 lb

## 2016-03-08 DIAGNOSIS — G4733 Obstructive sleep apnea (adult) (pediatric): Secondary | ICD-10-CM

## 2016-03-08 NOTE — Patient Instructions (Signed)
   It was a pleasure taking care of you today!  You are diagnosed with Obstructive Sleep Apnea or OSA.  You stop breathing  11 times/hr.   Continue using your CPAP machine.   Please make sure you use your CPAP device everytime you sleep.  We will monitor the usage of your machine per your insurance requirement.  Your insurance company may take the machine from you if you are not using it regularly.   Please clean the mask, tubings, filter, water reservoir with soapy water every week.  Please use distilled water for the water reservoir.   Please call the office or your machine provider (DME company) if you are having issues with the device.     Return to clinic in 1 year

## 2016-03-08 NOTE — Assessment & Plan Note (Addendum)
HST  12/2013  AHI 11.  On autocpap 5-15 cm water. DL for 2/13086/2017 : 65%30%, AHI < 1.    We extensively discussed the importance of treating OSA and the need to use PAP therapy.   Continue with autocpap 5-15 cm water.  He has lost 50 lbs and was asking whether he can stop using his autocpap.  I mentioned, we can do a rpt HST to determine if he still has OSA after losing weight.  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  Based on the fact that he does shift work, I recommend continuing with cpap since cpap will ensure he gets good quality sleep. He agrees.    Patient was instructed to have mask, tubings, filter, reservoir cleaned at least once a week with soapy water.  Patient was instructed to call the office if he/she is having issues with the PAP device.    I advised patient to obtain sufficient amount of sleep --  7 to 8 hours at least in a 24 hr period.  Patient was advised to follow good sleep hygiene.  Patient was advised NOT to engage in activities requiring concentration and/or vigilance if he/she is and  sleepy.  Patient is NOT to drive if he/she is sleepy.

## 2016-03-08 NOTE — Progress Notes (Signed)
Subjective:    Patient ID: Micheal [name], male    DOB: July 19, 1976, 40 y.o.   MRN: [id number]  HPI Pt is here for f/u on his OSA.   ROV 03/08/16 patient returns to the office as follow-up on his sleep apnea. Last seen was June 2016. Since last seen, he continues to wear CPAP. Feels better using it. More energy. Less sleepiness. Download the last month, 30%, AHI 0.4. He has lost 50 lbs.  Feels benefit of cpap.    Review of Systems  Constitutional: Negative.   HENT: Negative.   Eyes: Negative.   Respiratory: Negative.   Cardiovascular: Negative.   Gastrointestinal: Negative.   Endocrine: Negative.   Genitourinary: Negative.   Musculoskeletal: Negative.   Skin: Negative.   Allergic/Immunologic: Negative.   Neurological: Negative.   Hematological: Negative.   Psychiatric/Behavioral: Negative.   All other systems reviewed and are negative.  Past Medical History  Diagnosis Date  . Gilbert syndrome 2005    total bilirubin  1.8  . Nonspecific elevation of levels of transaminase or lactic acid dehydrogenase (LDH) 2006     ALT 51  . Hypothyroidism 2005    free T4 0.8; TSH 5.77  . ADHD (attention deficit hyperactivity disorder), combined type      Family History  Problem Relation Age of Onset  . Lung cancer Paternal Grandfather     smoker  . Lung cancer Paternal Grandmother     2nd hand smoke exposure   . Lung cancer Maternal Grandmother     smoker  . Transient ischemic attack Maternal Grandmother   . Hypertension Mother   . Pancreatic cancer Maternal Grandfather   . Diabetes      GGM  . Heart disease Neg Hx   . Deep vein thrombosis Sister 18    BCP & smoker     Past Surgical History  Procedure Laterality Date  . Wisdom tooth extraction      as teen    Social History   Social History  . Marital Status: Married    Spouse Name: N/A  . Number of Children: 4  . Years of Education: N/A   Occupational History  . 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 Smoking status: Never Smoker   . Smokeless tobacco: Never Used  . Alcohol Use: Yes     Comment: Rarely  . Drug Use: No  . Sexual Activity: Not on file   Other Topics Concern  . Not on file   Social History Narrative   1 son from previous marriage   2 stepchildren   1 son from current marriage     Allergies  Allergen Reactions  . Calcium Channel Blockers     Chest discomfort; negative EKG  . Hctz [Hydrochlorothiazide]     cramps     Outpatient Prescriptions Prior to Visit  Medication Sig Dispense Refill  . levothyroxine (SYNTHROID, LEVOTHROID) 50 MCG tablet Take 0.5 tablets (25 mcg total) by mouth daily before breakfast. 60 tablet 3  . methylphenidate 18 MG PO CR tablet Take 1-2 tablets (18-36 mg total) by mouth daily. 60 tablet 0   No facility-administered medications prior to visit.   No orders of the defined types were placed in this encounter.           Objective:   Physical Exam   Vitals:  Filed Vitals:   03/08/16 0854  BP: 124/74  Pulse: 70  Height:  5\' 11"  (1.803 m)  Weight: 229 lb (103.874 kg)  SpO2: 99%    Constitutional/General:  Pleasant, well-nourished, well-developed, not in any distress,  Comfortably seating.  Well kempt  Body mass index is 31.95 kg/(m^2). Wt Readings from Last 3 Encounters:  03/08/16 229 lb (103.874 kg)  12/16/15 231 lb (104.781 kg)  10/29/15 248 lb 12 oz (112.832 kg)      HEENT: Pupils equal and reactive to light and accommodation. Anicteric sclerae. Normal nasal mucosa.   No oral  lesions,  mouth clear,  oropharynx clear, no postnasal drip. (-) Oral thrush. No dental caries. (+) Retrognathia.  Airway - Mallampati class III  Neck: No masses. Midline trachea. No JVD, (-) LAD. (-) bruits appreciated.  Respiratory/Chest: Grossly normal chest. (-) deformity. (-) Accessory muscle use.  Symmetric expansion. (-) Tenderness on palpation.  Resonant on percussion.  Diminished BS on both  lower lung zones. (-) wheezing, crackles, rhonchi (-) egophony  Cardiovascular: Regular rate and  rhythm, heart sounds normal, no murmur or gallops, no peripheral edema  Gastrointestinal:  Normal bowel sounds. Soft, non-tender. No hepatosplenomegaly.  (-) masses.   Musculoskeletal:  Normal muscle tone. Normal gait.   Extremities: Grossly normal. (-) clubbing, cyanosis.  (-) edema  Skin: (-) rash,lesions seen.   Neurological/Psychiatric : alert, oriented to time, place, person. Normal mood and affect           Assessment & Plan:  OSA (obstructive sleep apnea) HST  12/2013  AHI 11.  On autocpap 5-15 cm water. DL for 1/61096/2017 : 60%30%, AHI < 1.    We extensively discussed the importance of treating OSA and the need to use PAP therapy.   Continue with autocpap 5-15 cm water.  He has lost 50 lbs and was asking whether he can stop using his autocpap.  I mentioned, we can do a rpt HST to determine if he still has OSA after losing weight.  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  Based on the fact that he does shift work, I recommend continuing with cpap since cpap will ensure he gets good quality sleep. He agrees.    Patient was instructed to have mask, tubings, filter, reservoir cleaned at least once a week with soapy water.  Patient was instructed to call the office if he/she is having issues with the PAP device.    I advised patient to obtain sufficient amount of sleep --  7 to 8 hours at least in a 24 hr period.  Patient was advised to follow good sleep hygiene.  Patient was advised NOT to engage in activities requiring concentration and/or vigilance if he/she is and  sleepy.  Patient is NOT to drive if he/she is sleepy.        Pollie MeyerJ. Angelo A de Dios, MD 03/08/2016, 9:25 AM [facility] Pager [phone] After 3 pm or if no answer, call [phone]

## 2016-03-21 ENCOUNTER — Encounter: Payer: Self-pay | Admitting: Pulmonary Disease

## 2016-04-11 ENCOUNTER — Encounter: Payer: Self-pay | Admitting: Internal Medicine

## 2016-04-11 MED ORDER — METHYLPHENIDATE HCL ER (OSM) 18 MG PO TBCR: 18.0000 mg | EXTENDED_RELEASE_TABLET | Freq: Every day | ORAL | 0 refills | Status: DC

## 2016-04-11 NOTE — Telephone Encounter (Signed)
Rx up front. MyChart message sent to pt. 

## 2016-04-11 NOTE — Telephone Encounter (Signed)
Last printed 02-29-16 #60 Last OV Acute 12-16-15 Next OV 09-29-16

## 2016-04-13 MED FILL — METHYLPHENIDATE ER 18 MG TA: 18 | 30 days supply | Qty: 60 | Fill #0

## 2016-05-09 ENCOUNTER — Encounter: Payer: Self-pay | Admitting: Internal Medicine

## 2016-05-09 ENCOUNTER — Ambulatory Visit (INDEPENDENT_AMBULATORY_CARE_PROVIDER_SITE_OTHER): Payer: 59 | Admitting: Internal Medicine

## 2016-05-09 DIAGNOSIS — M549 Dorsalgia, unspecified: Secondary | ICD-10-CM | POA: Insufficient documentation

## 2016-05-09 DIAGNOSIS — M546 Pain in thoracic spine: Secondary | ICD-10-CM | POA: Diagnosis not present

## 2016-05-09 MED ORDER — OMEPRAZOLE 20 MG PO CPDR: 20.0000 mg | DELAYED_RELEASE_CAPSULE | Freq: Every day | ORAL | 3 refills | Status: DC

## 2016-05-09 NOTE — Progress Notes (Signed)
Subjective:    Patient ID: Micheal Friedlanderlfred C Knoles, male    DOB: 01/30/1976, 40 y.o.   MRN: [id number]  HPI Here due to back pain still  Pain seems to be better---but has noticed it after eating at times Worried about his gallbladder Not clearly as related to movement as it was before More prominent after a big meal--no particular food 2-3 times per week--may last 1-2 hours  Occasional heartburn--usually feels this in throat or higher up from back pain No dysphagia No N/V  Current Outpatient Prescriptions on File Prior to Visit  Medication Sig Dispense Refill  . levothyroxine (SYNTHROID, LEVOTHROID) 50 MCG tablet Take 0.5 tablets (25 mcg total) by mouth daily before breakfast. 60 tablet 3  . methylphenidate 18 MG PO CR tablet Take 1-2 tablets (18-36 mg total) by mouth daily. 60 tablet 0   No current facility-administered medications on file prior to visit.     Allergies  Allergen Reactions  . Calcium Channel Blockers     Chest discomfort; negative EKG  . Hctz [Hydrochlorothiazide]     cramps    Past Medical History:  Diagnosis Date  . ADHD (attention deficit hyperactivity disorder), combined type   . Gilbert syndrome 2005   total bilirubin  1.8  . Hypothyroidism 2005   free T4 0.8; TSH 5.77  . Nonspecific elevation of levels of transaminase or lactic acid dehydrogenase (LDH) 2006    ALT 51    Past Surgical History:  Procedure Laterality Date  . WISDOM TOOTH EXTRACTION     as teen    Family History  Problem Relation Age of Onset  . Lung cancer Paternal Grandfather     smoker  . Lung cancer Paternal Grandmother     2nd hand smoke exposure   . Lung cancer Maternal Grandmother     smoker  . Transient ischemic attack Maternal Grandmother   . Hypertension Mother   . Pancreatic cancer Maternal Grandfather   . Diabetes      GGM  . Heart disease Neg Hx   . Deep vein thrombosis Sister [id number]    BCP & smoker    Social History   Social History  . Marital status:  Married    Spouse name: N/A  . Number of children: 4  . 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 Smoking status: Never Smoker  . Smokeless tobacco: Never Used  . Alcohol use Yes     Comment: Rarely  . Drug use: No  . Sexual activity: Not on file   Other Topics Concern  . Not on file   Social History Narrative   1 son from previous marriage   2 stepchildren   1 son from current marriage   Review of Systems Bowels are okay--better with healthier foods No cough  No SOB    Objective:   Physical Exam  Constitutional: He appears well-developed. No distress.  Neck: Normal range of motion. Neck supple. No thyromegaly present.  Cardiovascular: Normal rate, regular rhythm and normal heart sounds.  Exam reveals no gallop.   No murmur heard. Pulmonary/Chest: Effort normal and breath sounds normal. No respiratory distress. He has no wheezes. He has no rales.  Abdominal: He exhibits no distension. There is no tenderness. There is no rebound and no guarding.  No Murphy's sign  Musculoskeletal:  No spine or back tenderness  Lymphadenopathy:  He has no cervical adenopathy.          Assessment & Plan:

## 2016-05-09 NOTE — Progress Notes (Signed)
Pre visit review using our clinic review tool, if applicable. No additional management support is needed unless otherwise documented below in the visit note. 

## 2016-05-09 NOTE — Patient Instructions (Signed)
Please try omeprazole 20mg  daily on an empty stomach for at least 2 weeks. If your symptoms are gone, you can see if you can decrease to every other day--or only if the symptoms act up again.

## 2016-05-09 NOTE — Assessment & Plan Note (Signed)
Just to right---related to eating Sounds like it could be reflux related No gall bladder findings and doesn't seem muscular Discussed PPI trial

## 2016-05-16 MED FILL — OMEPRAZOLE DR 20 MG CAPSULE: 20 | 30 days supply | Qty: 30 | Fill #0

## 2016-05-20 ENCOUNTER — Other Ambulatory Visit: Payer: Self-pay

## 2016-05-20 MED ORDER — METHYLPHENIDATE HCL ER (OSM) 18 MG PO TBCR: 18.0000 mg | EXTENDED_RELEASE_TABLET | Freq: Every day | ORAL | 0 refills | Status: DC

## 2016-05-20 NOTE — Telephone Encounter (Signed)
Pt left v/m requesting rx for methylphenidate; call when ready for pick up; rx last printed # 60 on 08/14/17and pt last seen 10/29/15. Dr Alphonsus SiasLetvak out of office and not on computer until 05/24/16.Please advise.

## 2016-05-20 NOTE — Telephone Encounter (Signed)
Spoke to pt. Rx up front ready for pickup 

## 2016-05-20 NOTE — Telephone Encounter (Signed)
RX printed and signed and given to Shannon 

## 2016-05-23 MED FILL — METHYLPHENIDATE ER 18 MG TA: 18 | 30 days supply | Qty: 60 | Fill #0

## 2016-06-24 ENCOUNTER — Encounter: Payer: Self-pay | Admitting: Internal Medicine

## 2016-06-24 MED ORDER — LEVOTHYROXINE SODIUM 50 MCG PO TABS: 25.0000 ug | ORAL_TABLET | Freq: Every day | ORAL | 3 refills | Status: DC

## 2016-06-24 MED ORDER — METHYLPHENIDATE HCL ER (OSM) 18 MG PO TBCR: 18.0000 mg | EXTENDED_RELEASE_TABLET | Freq: Every day | ORAL | 0 refills | Status: DC

## 2016-06-24 NOTE — Telephone Encounter (Signed)
I have sent the levothyroxine to the pharmacy.  Methylphenidate last written 05-20-16 #60. Last OV 05-09-16 Next OV 09-29-16  Forwarding to Dr Ermalene SearingBedsole in Dr Karle StarchLetvak's absence to approve Methylphenidate rx.

## 2016-06-29 MED FILL — LEVOTHYROXINE 50 MCG TABLET: 50 | 90 days supply | Qty: 45 | Fill #0

## 2016-06-29 MED FILL — METHYLPHENIDATE ER 18 MG TA: 18 | 30 days supply | Qty: 60 | Fill #0

## 2016-07-26 ENCOUNTER — Telehealth: Payer: Self-pay | Admitting: Internal Medicine

## 2016-07-26 NOTE — Telephone Encounter (Signed)
ERROR

## 2016-08-08 ENCOUNTER — Encounter: Payer: Self-pay | Admitting: Internal Medicine

## 2016-08-08 MED ORDER — METHYLPHENIDATE HCL ER (OSM) 18 MG PO TBCR: 18.0000 mg | EXTENDED_RELEASE_TABLET | Freq: Every day | ORAL | 0 refills | Status: DC

## 2016-08-08 NOTE — Telephone Encounter (Signed)
RX printed and signed and given to Shannon 

## 2016-08-08 NOTE — Telephone Encounter (Signed)
Last written 06-24-16 #60 Last OV 05-09-16 Acute Next OV 09-29-16  [name] Dr Karle StarchLetvak's absence.

## 2016-08-11 MED FILL — METHYLPHENIDATE ER 18 MG TA: 18 | 30 days supply | Qty: 60 | Fill #0

## 2016-09-29 ENCOUNTER — Encounter: Payer: Self-pay | Admitting: Internal Medicine

## 2016-10-17 MED FILL — LEVOTHYROXINE 50 MCG TABLET: 50 | 90 days supply | Qty: 45 | Fill #1

## 2016-10-20 ENCOUNTER — Ambulatory Visit (INDEPENDENT_AMBULATORY_CARE_PROVIDER_SITE_OTHER): Payer: 59 | Admitting: Internal Medicine

## 2016-10-20 ENCOUNTER — Encounter: Payer: Self-pay | Admitting: Internal Medicine

## 2016-10-20 VITALS — BP 130/90 | HR 73 | Temp 98.1°F | Ht 70.5 in | Wt 213.0 lb

## 2016-10-20 DIAGNOSIS — F902 Attention-deficit hyperactivity disorder, combined type: Secondary | ICD-10-CM | POA: Diagnosis not present

## 2016-10-20 DIAGNOSIS — R200 Anesthesia of skin: Secondary | ICD-10-CM | POA: Diagnosis not present

## 2016-10-20 DIAGNOSIS — Z0000 Encounter for general adult medical examination without abnormal findings: Secondary | ICD-10-CM

## 2016-10-20 DIAGNOSIS — E039 Hypothyroidism, unspecified: Secondary | ICD-10-CM

## 2016-10-20 MED ORDER — METHYLPHENIDATE HCL ER (OSM) 18 MG PO TBCR: 18.0000 mg | EXTENDED_RELEASE_TABLET | Freq: Every day | ORAL | 0 refills | Status: DC

## 2016-10-20 NOTE — Assessment & Plan Note (Signed)
In feet Early neuropathy? Will check labs To neuro for eval if worsens

## 2016-10-20 NOTE — Progress Notes (Signed)
Subjective:    Patient ID: Micheal [name], male    DOB: 07/18/1976, 41 y.o.   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 levothyroxine (SYNTHROID, LEVOTHROID) 50 MCG tablet Take 0.5 tablets (25 mcg total) by mouth daily before breakfast. 60 tablet 3  . methylphenidate 18 MG PO CR tablet Take 1-2 tablets (18-36 mg total) by mouth daily. 60 tablet 0  . omeprazole (PRILOSEC) 20 MG capsule Take 1 capsule (20 mg total) by mouth daily. 30 capsule 3   No current facility-administered medications on file prior to visit.     Allergies  Allergen Reactions  . Calcium Channel Blockers     Chest discomfort; negative EKG  . Hctz [Hydrochlorothiazide]     cramps    Past Medical History:  Diagnosis Date  . ADHD (attention deficit hyperactivity disorder), combined type   . Gilbert syndrome 2005   total bilirubin  1.8  . Hypothyroidism 2005   free T4 0.8; TSH 5.77  . Nonspecific elevation of levels of transaminase or lactic acid dehydrogenase (LDH) 2006    ALT 51    Past Surgical History:  Procedure Laterality Date  . WISDOM TOOTH EXTRACTION     as teen    Family History  Problem Relation Age of Onset  . Hypertension Mother   . Deep vein thrombosis Sister 18    BCP & smoker  . Lung cancer Paternal Grandfather     smoker  . Lung cancer Paternal Grandmother     2nd hand smoke exposure   . Lung cancer  Maternal Grandmother     smoker  . Transient ischemic attack Maternal Grandmother   . Pancreatic cancer Maternal Grandfather   . Diabetes      GGM  . Heart disease Neg Hx     Social History   Social History  . Marital status: Married    Spouse name: N/A  . Number of children: 4  . 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 Smoking status: Never Smoker  . Smokeless tobacco: Never Used  . Alcohol use Yes     Comment: Rarely  . Drug use: No  . Sexual activity: Not on file   Other Topics Concern  . Not on file   Social History Narrative   1 son from previous marriage   2 stepchildren   1 son from current marriage   Review of Systems  Constitutional:       Has lost 50# over the past year---really working on fitness Wears seat belt 90%--discussed  HENT: Negative for congestion and tinnitus.        Slight hearing issues Keeps up with dentist  Eyes: Negative for visual disturbance.       No diplopia or unilateral vision loss  Respiratory: Negative for cough, chest tightness and shortness of breath.   Cardiovascular: Negative for chest pain and leg swelling.       Rare feeling of extra beat  Gastrointestinal: Negative for abdominal pain, blood in stool, constipation and nausea.  Endocrine: Negative for polydipsia and polyuria.  Genitourinary: Positive for frequency. Negative for difficulty urinating.       Occ slow stream first thing in AM if held all night  Musculoskeletal: Negative for arthralgias, back pain and joint swelling.  Skin: Negative for rash.       No suspicious lesions  Allergic/Immunologic: Negative for environmental allergies and immunocompromised state.  Neurological: Negative for dizziness, syncope, light-headedness and headaches.  Hematological: Negative for adenopathy. Does not bruise/bleed easily.  Psychiatric/Behavioral: Negative for dysphoric mood. The  patient is not nervous/anxious.        Sleeps well with the CPAP       Objective:   Physical Exam  Constitutional: He is oriented to person, place, and time. He appears well-developed and well-nourished. No distress.  HENT:  Head: Normocephalic and atraumatic.  Right Ear: External ear normal.  Left Ear: External ear normal.  Mouth/Throat: Oropharynx is clear and moist. No oropharyngeal exudate.  Eyes: Conjunctivae are normal. Pupils are equal, round, and reactive to light.  Neck: Normal range of motion. Neck supple. No thyromegaly present.  Cardiovascular: Normal rate, regular rhythm, normal heart sounds and intact distal pulses.  Exam reveals no gallop.   No murmur heard. Pulmonary/Chest: Effort normal and breath sounds normal. No respiratory distress. He has no wheezes. He has no rales.  Abdominal: Soft. There is no tenderness.  Musculoskeletal: He exhibits no edema or tenderness.  Lymphadenopathy:    He has no cervical adenopathy.  Neurological: He is alert and oriented to person, place, and time.  Skin: No rash noted. No erythema.  Psychiatric: He has a normal mood and affect. His behavior is normal.          Assessment & Plan:

## 2016-10-20 NOTE — Assessment & Plan Note (Signed)
Has done a great job with fitness No cancer screening yet UTD on imms

## 2016-10-20 NOTE — Progress Notes (Signed)
Pre visit review using our clinic review tool, if applicable. No additional management support is needed unless otherwise documented below in the visit note. 

## 2016-10-20 NOTE — Assessment & Plan Note (Signed)
Has done well on the med Still prefers to vary from 18-36mg  CSRS checked--only from here Will do UDS

## 2016-10-20 NOTE — Assessment & Plan Note (Signed)
Will recheck labs 

## 2016-10-21 ENCOUNTER — Other Ambulatory Visit: Payer: Self-pay | Admitting: Internal Medicine

## 2016-10-21 DIAGNOSIS — E538 Deficiency of other specified B group vitamins: Secondary | ICD-10-CM

## 2016-10-21 LAB — CBC WITH DIFFERENTIAL/PLATELET
BASOS PCT: 0.6 % (ref 0.0–3.0)
Basophils Absolute: 0 10*3/uL (ref 0.0–0.1)
EOS PCT: 1.3 % (ref 0.0–5.0)
Eosinophils Absolute: 0.1 10*3/uL (ref 0.0–0.7)
HEMATOCRIT: 44.6 % (ref 39.0–52.0)
HEMOGLOBIN: 15.6 g/dL (ref 13.0–17.0)
Lymphocytes Relative: 30.7 % (ref 12.0–46.0)
Lymphs Abs: 2 10*3/uL (ref 0.7–4.0)
MCHC: 35 g/dL (ref 30.0–36.0)
MCV: 89 fl (ref 78.0–100.0)
MONO ABS: 0.6 10*3/uL (ref 0.1–1.0)
MONOS PCT: 8.5 % (ref 3.0–12.0)
Neutro Abs: 3.9 10*3/uL (ref 1.4–7.7)
Neutrophils Relative %: 58.9 % (ref 43.0–77.0)
Platelets: 154 10*3/uL (ref 150.0–400.0)
RBC: 5.01 Mil/uL (ref 4.22–5.81)
RDW: 13.9 % (ref 11.5–15.5)
WBC: 6.6 10*3/uL (ref 4.0–10.5)

## 2016-10-21 LAB — TSH: TSH: 3.96 u[IU]/mL (ref 0.35–4.50)

## 2016-10-21 LAB — COMPREHENSIVE METABOLIC PANEL
ALBUMIN: 4.5 g/dL (ref 3.5–5.2)
ALT: 19 U/L (ref 0–53)
AST: 18 U/L (ref 0–37)
Alkaline Phosphatase: 95 U/L (ref 39–117)
BUN: 13 mg/dL (ref 6–23)
CHLORIDE: 109 meq/L (ref 96–112)
CO2: 27 mEq/L (ref 19–32)
Calcium: 9.1 mg/dL (ref 8.4–10.5)
Creatinine, Ser: 0.94 mg/dL (ref 0.40–1.50)
GFR: 93.79 mL/min (ref >60.00)
Glucose, Bld: 78 mg/dL (ref 70–99)
POTASSIUM: 4.6 meq/L (ref 3.5–5.1)
SODIUM: 141 meq/L (ref 135–145)
Total Bilirubin: 1 mg/dL (ref 0.2–1.2)
Total Protein: 6.8 g/dL (ref 6.0–8.3)

## 2016-10-21 LAB — LIPID PANEL
CHOL/HDL RATIO: 4
Cholesterol: 114 mg/dL (ref 0–200)
HDL: 32.1 mg/dL — ABNORMAL LOW (ref >39.00)
LDL CALC: 75 mg/dL (ref 0–99)
NONHDL: 82.28
Triglycerides: 34 mg/dL (ref 0.0–149.0)
VLDL: 6.8 mg/dL (ref 0.0–40.0)

## 2016-10-21 LAB — VITAMIN B12: VITAMIN B 12: 202 pg/mL — AB (ref 211–911)

## 2016-10-21 LAB — T4, FREE: FREE T4: 0.78 ng/dL (ref 0.60–1.60)

## 2016-10-24 MED FILL — CONCERTA 18 MG TABLET ER: 18 | 30 days supply | Qty: 60 | Fill #0

## 2016-10-28 ENCOUNTER — Encounter: Payer: Self-pay | Admitting: Internal Medicine

## 2016-10-28 LAB — TOXASSURE SELECT 13 (MW), URINE

## 2016-11-29 ENCOUNTER — Encounter: Payer: Self-pay | Admitting: Internal Medicine

## 2016-11-29 MED ORDER — METHYLPHENIDATE HCL ER (OSM) 18 MG PO TBCR: 18.0000 mg | EXTENDED_RELEASE_TABLET | Freq: Every day | ORAL | 0 refills | Status: DC

## 2016-11-29 NOTE — Telephone Encounter (Signed)
Last printed 10-20-16 #60 Last OV 10-20-16 nEXT ov 04-26-17

## 2016-12-01 ENCOUNTER — Encounter: Payer: Self-pay | Admitting: Internal Medicine

## 2016-12-01 ENCOUNTER — Other Ambulatory Visit: Payer: 59

## 2016-12-01 DIAGNOSIS — E538 Deficiency of other specified B group vitamins: Secondary | ICD-10-CM

## 2016-12-01 NOTE — Addendum Note (Signed)
Addended by: Alvina Chou on: 12/01/2016 02:20 PM   Modules accepted: Orders

## 2016-12-05 ENCOUNTER — Telehealth: Payer: Self-pay

## 2016-12-05 NOTE — Telephone Encounter (Signed)
Noted  

## 2016-12-05 NOTE — Telephone Encounter (Signed)
PLEASE NOTE: All timestamps contained within this report are represented as [place] Standard Time. CONFIDENTIALTY NOTICE: This fax transmission is intended only for the addressee. It contains information that is legally privileged, confidential or otherwise protected from use or disclosure. If you are not the intended recipient, you are strictly prohibited from reviewing, disclosing, copying using or disseminating any of this information or taking any action in reliance on or regarding this information. If you have received this fax in error, please notify us immediately by telephone so that we can arrange for its return to [place]. Phone: [phone], Toll-Free: [phone], Fax: [phone] Page: 1 of 1 Call Id: [id number] [facility] Night - Client TELEPHONE ADVICE RECORD [facility] Patient Name: Micheal [name] Gender: Male DOB: 08-07-1976 Age: 19 Y 7 M 1 D Return Phone Number: [phone] (Primary), [phone] (Secondary) City/State/Zip: [place] Client [facility] Night - Client Client Site [facility] - Night Physician Tillman Abide - MD Who Is Calling Patient / Member / Family / Caregiver Call Type Triage / Clinical Relationship To Patient Self Return Phone Number [phone] (Primary) Chief Complaint Foreign Body In Skin Reason for Call Symptomatic / Request for Health Information Initial Comment Caller states c/o splinter in left forefinger - area is red and swollen. Nurse Assessment Nurse: Stefano Gaul, RN, Dwana Curd Date/Time (Eastern Time): 12/04/2016 2:56:26 PM Confirm and document reason for call. If symptomatic, describe symptoms. ---Caller states he has a splinter in his left index finger. he tried to dig it out. has a little pain. Has swelling from his hand to his middle knuckle. Area is pink. Does the PT have any chronic conditions? (i.e. diabetes, asthma, etc.) ---Yes List chronic conditions.  ---hypothyroidism; acid reflux Guidelines Guideline Title Affirmed Question Skin Foreign Body [1] Wound looks infected (e.g., redness, red streaks, pus, swollen) AND [2] no fever Disp. Time Lamount Cohen Time) Disposition Final User 12/04/2016 3:04:21 PM See Physician within 24 Hours Yes Stefano Gaul, RN, Dwana Curd Referrals REFERRED TO PCP OFFICE Care Advice Given Per Guideline SEE PHYSICIAN WITHIN 24 HOURS: CALL BACK IF: * Fever occurs * You become worse. CARE ADVICE given per Skin Foreign Body (Adult) guideline. ANTIBIOTIC OINTMENT: Apply antibiotic ointment (OTC) to the infected area 3 times per day until the appointment. CLEANSING: Wash the infected area with warm water and an antibacterial soap three times a day. * IF OFFICE WILL BE OPEN: You need to be seen within the next 24 hours. Call your doctor when the office opens, and make an appointment.

## 2016-12-05 NOTE — Telephone Encounter (Deleted)
I spoke

## 2016-12-05 NOTE — Telephone Encounter (Signed)
Pt schedule is such that the first available appt pt could keep is 12/06/16 at 8 Am with Mayra Reel NP

## 2016-12-06 ENCOUNTER — Encounter: Payer: Self-pay | Admitting: Primary Care

## 2016-12-06 ENCOUNTER — Telehealth: Payer: Self-pay

## 2016-12-06 ENCOUNTER — Ambulatory Visit (INDEPENDENT_AMBULATORY_CARE_PROVIDER_SITE_OTHER): Payer: 59 | Admitting: Primary Care

## 2016-12-06 ENCOUNTER — Other Ambulatory Visit (INDEPENDENT_AMBULATORY_CARE_PROVIDER_SITE_OTHER): Payer: 59

## 2016-12-06 VITALS — BP 120/78 | HR 60 | Temp 97.7°F | Ht 70.5 in | Wt 204.8 lb

## 2016-12-06 DIAGNOSIS — M7989 Other specified soft tissue disorders: Secondary | ICD-10-CM | POA: Diagnosis not present

## 2016-12-06 DIAGNOSIS — E538 Deficiency of other specified B group vitamins: Secondary | ICD-10-CM

## 2016-12-06 LAB — VITAMIN B12: Vitamin B-12: 547 pg/mL (ref 211–911)

## 2016-12-06 NOTE — Progress Notes (Signed)
Pre visit review using our clinic review tool, if applicable. No additional management support is needed unless otherwise documented below in the visit note. 

## 2016-12-06 NOTE — Progress Notes (Signed)
Subjective:    Patient ID: Micheal [name], male    DOB: 10-05-1975, 41 y.o.   MRN: [id number]  HPI   Micheal [name] is a 41 year old male who presents today with a chief complaint splinter. He was working with wood Saturday this past weekend while wearing gloves and got a splinter. He pulled out a big chunk of wood from the site. He noticed swelling and tenderness to the entire finger on Sunday. He's unsure if the splinter is out. Overall he's noticed improvement in swelling, redness, and pain. He denies fevers, drainage from the site. He's not been soaking his finger.  Review of Systems  Constitutional: Negative for fever.  Skin: Positive for color change and wound.       Past Medical History:  Diagnosis Date  . ADHD (attention deficit hyperactivity disorder), combined type   . Gilbert syndrome 2005   total bilirubin  1.8  . Hypothyroidism 2005   free T4 0.8; TSH 5.77  . Nonspecific elevation of levels of transaminase or lactic acid dehydrogenase (LDH) 2006    ALT 51     Social History   Social History  . Marital status: Married    Spouse name: N/A  . Number of children: 4  . 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 Smoking status: Never Smoker  . Smokeless tobacco: Never Used  . Alcohol use Yes     Comment: Rarely  . Drug use: No  . Sexual activity: Not on file   Other Topics Concern  . Not on file   Social History Narrative   1 son from previous marriage   2 stepchildren   1 son from current marriage    Past Surgical History:  Procedure Laterality Date  . WISDOM TOOTH EXTRACTION     as teen    Family History  Problem Relation Age of Onset  . Hypertension Mother   . Deep vein thrombosis Sister 18    BCP & smoker  . Lung cancer Paternal Grandfather     smoker  . Lung cancer Paternal Grandmother     2nd hand smoke exposure   . Lung cancer Maternal  Grandmother     smoker  . Transient ischemic attack Maternal Grandmother   . Pancreatic cancer Maternal Grandfather   . Diabetes      GGM  . Heart disease Neg Hx     Allergies  Allergen Reactions  . Calcium Channel Blockers     Chest discomfort; negative EKG  . Hctz [Hydrochlorothiazide]     cramps    Current Outpatient Prescriptions on File Prior to Visit  Medication Sig Dispense Refill  . levothyroxine (SYNTHROID, LEVOTHROID) 50 MCG tablet Take 0.5 tablets (25 mcg total) by mouth daily before breakfast. 60 tablet 3  . methylphenidate 18 MG PO CR tablet Take 1-2 tablets (18-36 mg total) by mouth daily. 60 tablet 0   No current facility-administered medications on file prior to visit.     BP 120/78   Pulse 60   Temp 97.7 F (36.5 C) (Oral)   Ht 5' 10.5" (1.791 m)   Wt 204 lb 12.8 oz (92.9 kg)   SpO2 99%   BMI 28.97 kg/m    Objective:   Physical Exam  Constitutional: He appears well-nourished.  Neck: Neck supple.  Skin: Skin is warm and dry.  Mild swelling  with erythema to anterior left second digit near PIP joint. Mildly tender.           Assessment & Plan:  Splinter:  Noticed on Saturday when cutting wood. Tdap UTD. Site seems to be improving overall based off of Sunday's symptoms. Doesn't appear to be acutely infected.  No obvious splinter remaining to site.  Will have him soak finger TID, continue Neosporin.  Return precautions provided.  Morrie Sheldon, NP

## 2016-12-06 NOTE — Telephone Encounter (Signed)
Form filled out and faxed back to [employer]. Waiting on response

## 2016-12-06 NOTE — Patient Instructions (Signed)
Start soaking your finger in betadine solution 2-3 times daily for 20 minute intervals.  Please message me if you notice increased swelling, redness, pain.  It was a pleasure to see you today!

## 2016-12-07 MED FILL — CONCERTA 18 MG TABLET ER: 18 | 30 days supply | Qty: 60 | Fill #0

## 2016-12-09 NOTE — Telephone Encounter (Signed)
Authorization received for approval until 12-06-17

## 2016-12-30 ENCOUNTER — Encounter: Payer: Self-pay | Admitting: Sports Medicine

## 2016-12-30 ENCOUNTER — Ambulatory Visit: Payer: 59

## 2016-12-30 DIAGNOSIS — R52 Pain, unspecified: Secondary | ICD-10-CM

## 2016-12-31 NOTE — Progress Notes (Signed)
Patient was a NO SHOW This encounter was created in error - please disregard. 

## 2017-01-02 MED FILL — LEVOTHYROXINE 50 MCG TABLET: 50 | 90 days supply | Qty: 45 | Fill #2

## 2017-01-06 ENCOUNTER — Ambulatory Visit: Payer: 59

## 2017-01-06 ENCOUNTER — Ambulatory Visit (INDEPENDENT_AMBULATORY_CARE_PROVIDER_SITE_OTHER): Payer: 59 | Admitting: Sports Medicine

## 2017-01-06 ENCOUNTER — Ambulatory Visit (INDEPENDENT_AMBULATORY_CARE_PROVIDER_SITE_OTHER): Payer: 59

## 2017-01-06 DIAGNOSIS — M779 Enthesopathy, unspecified: Secondary | ICD-10-CM

## 2017-01-06 DIAGNOSIS — M2142 Flat foot [pes planus] (acquired), left foot: Secondary | ICD-10-CM

## 2017-01-06 DIAGNOSIS — R52 Pain, unspecified: Secondary | ICD-10-CM

## 2017-01-06 DIAGNOSIS — M722 Plantar fascial fibromatosis: Secondary | ICD-10-CM

## 2017-01-06 DIAGNOSIS — M2141 Flat foot [pes planus] (acquired), right foot: Secondary | ICD-10-CM

## 2017-01-06 NOTE — Progress Notes (Signed)
Dg foot  

## 2017-01-06 NOTE — Progress Notes (Signed)
Subjective: Micheal [name] is a 41 y.o. male patient who presents to office for evaluation of bilateral foot pain. Patient admits that pain is mostly at ball of foot with occasional pulling sensation. States pad tried helps. Denies injury/trip/fall/sprain/any causative factors.   Patient Active Problem List   Diagnosis Date Noted  . Sensory loss 10/20/2016  . Back pain 05/09/2016  . Thoracic sprain 12/16/2015  . ADHD (attention deficit hyperactivity disorder), combined type   . Preventative health care 08/05/2014  . Hypothyroidism   . OSA (obstructive sleep apnea) 12/05/2013  . IBS (irritable bowel syndrome) 11/06/2013    Current Outpatient Prescriptions on File Prior to Visit  Medication Sig Dispense Refill  . lansoprazole (PREVACID) 15 MG capsule Take 15 mg by mouth daily.     Marland Kitchen. levothyroxine (SYNTHROID, LEVOTHROID) 50 MCG tablet Take 0.5 tablets (25 mcg total) by mouth daily before breakfast. 60 tablet 3  . methylphenidate 18 MG PO CR tablet Take 1-2 tablets (18-36 mg total) by mouth daily. 60 tablet 0   No current facility-administered medications on file prior to visit.     Allergies  Allergen Reactions  . Calcium Channel Blockers     Chest discomfort; negative EKG  . Hctz [Hydrochlorothiazide]     cramps    Objective:  General: Alert and oriented x3 in no acute distress  Dermatology: No open lesions bilateral lower extremities, no webspace macerations, no ecchymosis bilateral, all nails x 10 are well manicured.  Vascular: Dorsalis Pedis and Posterior Tibial pedal pulses palpable, Capillary Fill Time 3 seconds,(+) pedal hair growth bilateral, no edema bilateral lower extremities, Temperature gradient within normal limits.  Neurology: Micheal [name] sensation intact via light touch bilateral. (- )Tinels sign bilateral.   Musculoskeletal: Minimal tenderness with palpation at ball on left and right foot that extends into arch, Pes planus foot type bilateral. Strength within normal  limits in all groups bilateral.   Gait: Non-Antalgic gait  Xrays  Left and Right Foot   Impression: Calcaneal spur and midtarsal breech suggestive of pes planus.   Assessment and Plan: Problem List Items Addressed This Visit    None    Visit Diagnoses    Capsulitis    -  Primary   Pain       Relevant Orders   DG Foot Complete Left   DG Foot Complete Right   Plantar fasciitis, bilateral       Pes planus of both feet           -Complete examination performed -Xrays reviewed -Discussed treatement options for capsulitis and fasciitis  -Recommend custom insoles -Recommend good supportive shoes and daily stretching and icing to prevent worsening of symptoms -Patient to return to office as needed/orthotic casting when ready or sooner if condition worsens.  Micheal [name], DPM

## 2017-01-12 ENCOUNTER — Ambulatory Visit: Payer: 59 | Admitting: Sports Medicine

## 2017-01-17 ENCOUNTER — Telehealth: Payer: Self-pay | Admitting: Internal Medicine

## 2017-01-17 MED ORDER — METHYLPHENIDATE HCL ER (OSM) 18 MG PO TBCR: 18.0000 mg | EXTENDED_RELEASE_TABLET | Freq: Every day | ORAL | 0 refills | Status: DC

## 2017-01-17 NOTE — Telephone Encounter (Signed)
Araceli, can you print this and have her sign it and then give it to me? Thanks

## 2017-01-17 NOTE — Telephone Encounter (Signed)
rx up front ready for pickup. 

## 2017-01-17 NOTE — Telephone Encounter (Signed)
Ok to refill one time only. 

## 2017-01-17 NOTE — Telephone Encounter (Signed)
Last printed 11-29-16 #60 Last CPE 11-29-16 Next OV 04-25-17  Forwarding to Dr Dayton MartesAron in Dr Karle StarchLetvak's absence. Please send back to me when approved/denied. Thanks

## 2017-01-17 NOTE — Telephone Encounter (Signed)
Pt walked in needing a rx for methylpheniate Please call when ready

## 2017-01-18 ENCOUNTER — Ambulatory Visit: Payer: 59 | Admitting: Sports Medicine

## 2017-01-20 MED FILL — CONCERTA 18 MG TABLET ER: 18 | 30 days supply | Qty: 60 | Fill #0

## 2017-02-02 ENCOUNTER — Encounter: Payer: Self-pay | Admitting: Family Medicine

## 2017-02-02 ENCOUNTER — Ambulatory Visit (INDEPENDENT_AMBULATORY_CARE_PROVIDER_SITE_OTHER): Payer: 59 | Admitting: Family Medicine

## 2017-02-02 VITALS — BP 140/90 | HR 63 | Temp 98.3°F | Ht 70.5 in | Wt 206.5 lb

## 2017-02-02 DIAGNOSIS — M7711 Lateral epicondylitis, right elbow: Secondary | ICD-10-CM

## 2017-02-02 MED ORDER — DICLOFENAC SODIUM 1 % TD GEL: 2.0000 g | Freq: Four times a day (QID) | TRANSDERMAL | 5 refills | Status: DC

## 2017-02-02 MED FILL — DICLOFENAC SODIUM 1% GEL: 1 | 38 days supply | Qty: 300 | Fill #0

## 2017-02-02 NOTE — Progress Notes (Signed)
Dr. Karleen Hampshire T. Liya Strollo, MD, CAQ Sports Medicine [facility] [address] [place], [address] Phone: [phone] Fax: [phone]  02/02/2017  Patient: Micheal [name], MRN: [id number], DOB: [birth year], 41 y.o.  Primary Physician:  Micheal Schwalbe, MD   Chief Complaint  Patient presents with  . Elbow Pain    Right    Subjective:   Micheal [name] presents with lateral elbow pain.  Length of symptoms: 3 weeks Hand effected: R  Patient describes a dull ache on the lateral elbow. There is some translation in the proximal forearm and in the distal upper arm. It is painful to lift with the hand facing down and to lift with the thumb in an upright position. Supination is painful. Patient points to the lateral epicondyle as the point of maximal tenderness near ECRB.  R LE. No injury. Doing some remodelling at home intermittently. Has been progressively worsening, now been over three weeks. Has done some ice and alleve.   No trauma.   No prior fractures or operative interventions in the effective hand. Prior PT or HEP: none  Denies numbness or tingling. No significant neck or shoulder pain.  Hand of dominance: R  The PMH, PSH, Social History, Family History, Medications, and allergies have been reviewed in [facility], and have been updated if relevant.  Patient Active Problem List   Diagnosis Date Noted  . Sensory loss 10/20/2016  . ADHD (attention deficit hyperactivity disorder), combined type   . Preventative health care 08/05/2014  . Hypothyroidism   . OSA (obstructive sleep apnea) 12/05/2013  . IBS (irritable bowel syndrome) 11/06/2013    Past Medical History:  Diagnosis Date  . ADHD (attention deficit hyperactivity disorder), combined type   . Gilbert syndrome 2005   total bilirubin  1.8  . Hypothyroidism 2005   free T4 0.8; TSH 5.77  . Nonspecific elevation of levels of transaminase or lactic acid dehydrogenase (LDH) 2006    ALT 51     Past Surgical History:  Procedure Laterality Date  . WISDOM TOOTH EXTRACTION     as teen    Social History   Social History  . Marital status: Married    Spouse name: N/A  . Number of children: 4  . Years of education: N/A   Occupational History  . 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 Smoking status: Never Smoker  . Smokeless tobacco: Never Used  . Alcohol use Yes     Comment: Rarely  . Drug use: No  . Sexual activity: Not on file   Other Topics Concern  . Not on file   Social History Narrative   1 son from previous marriage   2 stepchildren   1 son from current marriage    Family History  Problem Relation Age of Onset  . Hypertension Mother   . Deep vein thrombosis Sister 18       BCP & smoker  . Lung cancer Paternal Grandfather        smoker  . Lung cancer Paternal Grandmother        2nd hand smoke exposure   . Lung cancer Maternal Grandmother        smoker  . Transient ischemic attack Maternal Grandmother   . Pancreatic cancer Maternal Grandfather   . Diabetes Unknown        GGM  . Heart disease Neg Hx  Allergies  Allergen Reactions  . Calcium Channel Blockers     Chest discomfort; negative EKG  . Hctz [Hydrochlorothiazide]     cramps    Medication list reviewed and updated in full in [facility].  GEN: No fevers, chills. Nontoxic. Primarily MSK c/o today. MSK: Detailed in the HPI GI: tolerating PO intake without difficulty Neuro: No numbness, parasthesias, or tingling associated. Otherwise the pertinent positives of the ROS are noted above.   Objective:   Blood pressure 140/90, pulse 63, temperature 98.3 F (36.8 C), temperature source Oral, height 5' 10.5" (1.791 m), weight 206 lb 8 oz (93.7 kg).  GEN: Well-developed,well-nourished,in no acute distress; alert,appropriate and cooperative throughout examination HEENT: Normocephalic and atraumatic without obvious abnormalities.  Ears, externally no deformities PULM: Breathing comfortably in no respiratory distress EXT: No clubbing, cyanosis, or edema PSYCH: Normally interactive. Cooperative during the interview. Pleasant. Friendly and conversant. Not anxious or depressed appearing. Normal, full affect.  R elbow Ecchymosis or edema: neg ROM: full flexion, extension, pronation, supination Shoulder ROM: Full Flexion: 5/5 Extension: 5/5, PAINFUL Supination: 5/5, PAINFUL Pronation: 5/5 Wrist ext: 5/5 Wrist flexion: 5/5 No gross bony abnormality Varus and Valgus stress: stable ECRB tenderness: YES, TTP Medial epicondyle: NT Lateral epicondyle, resisted wrist extension from wrist full pronation and flexion: PAINFUL grip: 5/5  sensation intact Tinel's, Elbow: negative  Subjective:   Lateral epicondylitis of right elbow  Elbow anatomy was reviewed, and tendinopathy was explained.  Pt. given a [facility] rehab [facility] on elbow rehabiliation.  Series of concentric and eccentric exercises should be done starting with no weight, work up to 1 lb, hammer, etc.  Use counterforce strap if working or using hands.  Formal PT would be beneficial. Emphasized stretching an cross-friction massage Emphasized proper palms up lifting biomechanics to unload ECRB Reviewed grip training  Follow-up: 4-6 weeks if not better  New Prescriptions   DICLOFENAC SODIUM (VOLTAREN) 1 % GEL    Apply 2 g topically 4 (four) times daily.   Signed,  Micheal GaleaSpencer T. Mirage Pfefferkorn, MD   Patient's Medications  New Prescriptions   DICLOFENAC SODIUM (VOLTAREN) 1 % GEL    Apply 2 g topically 4 (four) times daily.  Previous Medications   LEVOTHYROXINE (SYNTHROID, LEVOTHROID) 50 MCG TABLET    Take 0.5 tablets (25 mcg total) by mouth daily before breakfast.   METHYLPHENIDATE 18 MG PO CR TABLET    Take 1-2 tablets (18-36 mg total) by mouth daily.  Modified Medications   No medications on file  Discontinued Medications   LANSOPRAZOLE  (PREVACID) 15 MG CAPSULE    Take 15 mg by mouth daily.

## 2017-03-14 ENCOUNTER — Other Ambulatory Visit: Payer: Self-pay | Admitting: Family Medicine

## 2017-03-14 NOTE — Telephone Encounter (Signed)
Last refill 01/17/17  Last OV 02/02/17 with Dr. Patsy Lageropland.  Ok to refill?

## 2017-03-15 MED ORDER — METHYLPHENIDATE HCL ER (OSM) 18 MG PO TBCR: 18.0000 mg | EXTENDED_RELEASE_TABLET | Freq: Every day | ORAL | 0 refills | Status: DC

## 2017-03-15 MED FILL — CONCERTA 18 MG TABLET ER: 18 | 30 days supply | Qty: 60 | Fill #0

## 2017-03-15 NOTE — Telephone Encounter (Signed)
Spoke to pt. Rx up front ready for pickup 

## 2017-04-26 ENCOUNTER — Ambulatory Visit (INDEPENDENT_AMBULATORY_CARE_PROVIDER_SITE_OTHER): Payer: 59 | Admitting: Internal Medicine

## 2017-04-26 ENCOUNTER — Encounter: Payer: Self-pay | Admitting: Internal Medicine

## 2017-04-26 VITALS — BP 130/84 | HR 71 | Temp 98.1°F | Wt 208.0 lb

## 2017-04-26 DIAGNOSIS — J069 Acute upper respiratory infection, unspecified: Secondary | ICD-10-CM | POA: Insufficient documentation

## 2017-04-26 DIAGNOSIS — F902 Attention-deficit hyperactivity disorder, combined type: Secondary | ICD-10-CM | POA: Diagnosis not present

## 2017-04-26 MED ORDER — METHYLPHENIDATE HCL ER (OSM) 18 MG PO TBCR: 18.0000 mg | EXTENDED_RELEASE_TABLET | Freq: Every day | ORAL | 0 refills | Status: DC

## 2017-04-26 NOTE — Assessment & Plan Note (Signed)
Seems to be self limited viral infection Discussed supportive care 

## 2017-04-26 NOTE — Progress Notes (Signed)
Subjective:    Patient ID: Micheal Friedlanderlfred C [name], male    DOB: 12/20/1975, 41 y.o.   MRN: [id number]  HPI Here for follow up of ADHD  Still using the methylphenidate 1-2 daily at work Not consistently taking on day's off No irritability No problems with sleep Wife still feels he needs the higher dose--but he is trying to limit He does notice improvement in impulse control on the medication  Also recent ?cold symptoms Mostly head congestion Some dry cough---worse at night May be some better now Wouldn't have come in separately  Current Outpatient Prescriptions on File Prior to Visit  Medication Sig Dispense Refill  . diclofenac sodium (VOLTAREN) 1 % GEL Apply 2 g topically 4 (four) times daily. 3 Tube 5  . levothyroxine (SYNTHROID, LEVOTHROID) 50 MCG tablet Take 0.5 tablets (25 mcg total) by mouth daily before breakfast. 60 tablet 3  . methylphenidate 18 MG PO CR tablet Take 1-2 tablets (18-36 mg total) by mouth daily. 60 tablet 0   No current facility-administered medications on file prior to visit.     Allergies  Allergen Reactions  . Calcium Channel Blockers     Chest discomfort; negative EKG  . Hctz [Hydrochlorothiazide]     cramps    Past Medical History:  Diagnosis Date  . ADHD (attention deficit hyperactivity disorder), combined type   . Gilbert syndrome 2005   total bilirubin  1.8  . Hypothyroidism 2005   free T4 0.8; TSH 5.77  . Nonspecific elevation of levels of transaminase or lactic acid dehydrogenase (LDH) 2006    ALT 51    Past Surgical History:  Procedure Laterality Date  . WISDOM TOOTH EXTRACTION     as teen    Family History  Problem Relation Age of Onset  . Hypertension Mother   . Deep vein thrombosis Sister 18       BCP & smoker  . Lung cancer Paternal Grandfather        smoker  . Lung cancer Paternal Grandmother        2nd hand smoke exposure   . Lung cancer Maternal Grandmother        smoker  . Transient ischemic attack Maternal  Grandmother   . Pancreatic cancer Maternal Grandfather   . Diabetes Unknown        GGM  . Heart disease Neg Hx     Social History   Social History  . Marital status: Married    Spouse name: N/A  . Number of children: 4  . Years of education: N/A   Occupational History  . 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 Smoking status: Never Smoker  . Smokeless tobacco: Never Used  . Alcohol use Yes     Comment: Rarely  . Drug use: No  . Sexual activity: Not on file   Other Topics Concern  . Not on file   Social History Narrative   1 son from previous marriage   2 stepchildren   1 son from current marriage   Review of Systems Appetite off a lot on the medication---usually not a big deal if he takes it with food Still does shift work--that affects his sleep but generally okay    Objective:   Physical Exam  Constitutional: He appears well-developed. No distress.  HENT:  Mouth/Throat: Oropharynx is clear and moist. No oropharyngeal exudate.  Neck: No thyromegaly present.  Pulmonary/Chest: Effort normal and breath  sounds normal. No respiratory distress. He has no wheezes. He has no rales.  Lymphadenopathy:    He has no cervical adenopathy.  Psychiatric: He has a normal mood and affect. His behavior is normal.          Assessment & Plan:

## 2017-04-26 NOTE — Assessment & Plan Note (Signed)
Doing well on this Discussed again that he should take the lowest effective dose Okay to skip on days off Will defer the UDS CSRS shows only Rx here

## 2017-04-27 MED FILL — CONCERTA 18 MG TABLET ER: 18 | 30 days supply | Qty: 60 | Fill #0

## 2017-05-02 ENCOUNTER — Other Ambulatory Visit: Payer: 59 | Admitting: Orthotics

## 2017-05-04 MED FILL — LEVOTHYROXINE 50 MCG TABLET: 50 | 90 days supply | Qty: 45 | Fill #3

## 2017-05-12 DIAGNOSIS — H5213 Myopia, bilateral: Secondary | ICD-10-CM | POA: Diagnosis not present

## 2017-05-30 ENCOUNTER — Ambulatory Visit (INDEPENDENT_AMBULATORY_CARE_PROVIDER_SITE_OTHER): Payer: 59 | Admitting: Orthotics

## 2017-05-30 DIAGNOSIS — M2142 Flat foot [pes planus] (acquired), left foot: Secondary | ICD-10-CM

## 2017-05-30 DIAGNOSIS — M779 Enthesopathy, unspecified: Secondary | ICD-10-CM

## 2017-05-30 DIAGNOSIS — R52 Pain, unspecified: Secondary | ICD-10-CM

## 2017-05-30 DIAGNOSIS — M722 Plantar fascial fibromatosis: Secondary | ICD-10-CM

## 2017-05-30 DIAGNOSIS — M2141 Flat foot [pes planus] (acquired), right foot: Secondary | ICD-10-CM

## 2017-05-30 NOTE — Progress Notes (Signed)
Patient came in upon recommedation Dr. Marylene Land. Patient presents with thinning fat pad, possible heel spur, and possible Mortons neuroma 2/3 interspace left.   Patient was cast w/ goal of offload pressure points creating discomfort.  Richy to fab CMFO w/ heel/punch cushion, FF cushion, and neuroma pad.

## 2017-05-31 ENCOUNTER — Other Ambulatory Visit: Payer: Self-pay | Admitting: Internal Medicine

## 2017-05-31 MED ORDER — METHYLPHENIDATE HCL ER (OSM) 18 MG PO TBCR: 18.0000 mg | EXTENDED_RELEASE_TABLET | Freq: Every day | ORAL | 0 refills | Status: DC

## 2017-05-31 NOTE — Telephone Encounter (Signed)
Last filled #60 04-26-17 Last OV 04-26-17 Next OV 10-23-17

## 2017-06-02 MED FILL — CONCERTA 18 MG TABLET ER: 18 | 30 days supply | Qty: 60 | Fill #0

## 2017-06-16 ENCOUNTER — Ambulatory Visit (INDEPENDENT_AMBULATORY_CARE_PROVIDER_SITE_OTHER): Payer: 59 | Admitting: Family Medicine

## 2017-06-16 ENCOUNTER — Encounter: Payer: Self-pay | Admitting: Family Medicine

## 2017-06-16 VITALS — Temp 98.5°F | Ht 70.5 in | Wt 213.0 lb

## 2017-06-16 DIAGNOSIS — S61211A Laceration without foreign body of left index finger without damage to nail, initial encounter: Secondary | ICD-10-CM

## 2017-06-16 NOTE — Patient Instructions (Signed)
WE NOW OFFER   [facility] FAST TRACK!!!  SAME DAY Appointments for ACUTE CARE  Such as: Sprains, Injuries, cuts, abrasions, rashes, muscle pain, joint pain, back pain Colds, flu, sore throats, headache, allergies, cough, fever  Ear pain, sinus and eye infections Abdominal pain, nausea, vomiting, diarrhea, upset stomach Animal/insect bites  3 Easy Ways to Schedule: Walk-In Scheduling Call in scheduling Mychart Sign-up: [email or web address]         

## 2017-06-16 NOTE — Progress Notes (Signed)
   Subjective:    Patient ID: Micheal [name], male    DOB: 06/24/1976, 41 y.o.   MRN: [id number]  HPI Here to check his left index finger. About 90 minutes ago he was reaching into his toolbox at home and his finger struck the exposed edge of a razor blade. He wrapped the finger in gauze and came in to see if he needs sutures.    Review of Systems  Constitutional: Negative.   Respiratory: Negative.   Cardiovascular: Negative.   Skin: Positive for wound.       Objective:   Physical Exam  Constitutional: He appears well-developed and well-nourished.  Cardiovascular: Normal rate, regular rhythm, normal heart sounds and intact distal pulses.   Pulmonary/Chest: Effort normal and breath sounds normal.  Skin:  There is a small laceration on the distal left index finger. The wound is closed and there is no active bleeding.           Assessment & Plan:  Finger laceration. No sutures are required. We dressed the wound with Neosporin and gauze. Recheck prn.  Gershon CraneStephen Tashaya Ancrum, MD

## 2017-06-20 ENCOUNTER — Encounter: Payer: 59 | Admitting: Orthotics

## 2017-06-29 ENCOUNTER — Encounter: Payer: 59 | Admitting: Orthotics

## 2017-07-03 ENCOUNTER — Other Ambulatory Visit: Payer: Self-pay | Admitting: Internal Medicine

## 2017-07-04 ENCOUNTER — Ambulatory Visit: Payer: 59 | Admitting: Orthotics

## 2017-07-04 DIAGNOSIS — M2141 Flat foot [pes planus] (acquired), right foot: Secondary | ICD-10-CM

## 2017-07-04 DIAGNOSIS — M2142 Flat foot [pes planus] (acquired), left foot: Secondary | ICD-10-CM

## 2017-07-04 DIAGNOSIS — M722 Plantar fascial fibromatosis: Secondary | ICD-10-CM

## 2017-07-04 MED ORDER — METHYLPHENIDATE HCL ER (OSM) 18 MG PO TBCR: 18.0000 mg | EXTENDED_RELEASE_TABLET | Freq: Every day | ORAL | 0 refills | Status: DC

## 2017-07-04 NOTE — Progress Notes (Signed)
Patient came in today to pick up custom made foot orthotics.  The goals were accomplished and the patient reported no dissatisfaction with said orthotics.  Patient was advised of breakin period and how to report any issues. 

## 2017-07-04 NOTE — Telephone Encounter (Signed)
Last printed 05-31-17 #60 Last OV 04-26-17 Next OV 10-23-17

## 2017-07-05 MED FILL — CONCERTA 18 MG TABLET ER: 18 | 30 days supply | Qty: 60 | Fill #0

## 2017-08-16 ENCOUNTER — Other Ambulatory Visit: Payer: Self-pay | Admitting: Internal Medicine

## 2017-08-16 MED ORDER — METHYLPHENIDATE HCL ER (OSM) 18 MG PO TBCR: 18.0000 mg | EXTENDED_RELEASE_TABLET | Freq: Every day | ORAL | 0 refills | Status: DC

## 2017-08-16 NOTE — Telephone Encounter (Signed)
Lm on pts vm and informed him Rx is available for pickup from the front desk 

## 2017-08-16 NOTE — Telephone Encounter (Signed)
Last Rx 07/04/2017. Last OV 03/2017

## 2017-08-17 MED FILL — CONCERTA 18 MG TABLET ER: 18 | 30 days supply | Qty: 60 | Fill #0

## 2017-08-23 ENCOUNTER — Other Ambulatory Visit: Payer: Self-pay | Admitting: Internal Medicine

## 2017-08-24 MED FILL — LEVOTHYROXINE 50 MCG TABLET: 50 | 90 days supply | Qty: 45 | Fill #0

## 2017-09-22 ENCOUNTER — Other Ambulatory Visit: Payer: Self-pay | Admitting: Internal Medicine

## 2017-09-25 MED ORDER — METHYLPHENIDATE HCL ER (OSM) 18 MG PO TBCR: 18.0000 mg | EXTENDED_RELEASE_TABLET | Freq: Every day | ORAL | 0 refills | Status: DC

## 2017-09-25 MED FILL — CONCERTA 18 MG TABLET ER: 18 | 30 days supply | Qty: 60 | Fill #0

## 2017-09-25 NOTE — Telephone Encounter (Signed)
Last filled 08-16-17 #60 Last OV 04-26-17 Next OV 10-23-17

## 2017-10-23 ENCOUNTER — Ambulatory Visit (INDEPENDENT_AMBULATORY_CARE_PROVIDER_SITE_OTHER): Payer: 59 | Admitting: Internal Medicine

## 2017-10-23 ENCOUNTER — Encounter: Payer: Self-pay | Admitting: Internal Medicine

## 2017-10-23 VITALS — BP 130/80 | HR 74 | Temp 98.4°F | Ht 71.0 in | Wt 218.5 lb

## 2017-10-23 DIAGNOSIS — G4733 Obstructive sleep apnea (adult) (pediatric): Secondary | ICD-10-CM

## 2017-10-23 DIAGNOSIS — E039 Hypothyroidism, unspecified: Secondary | ICD-10-CM | POA: Diagnosis not present

## 2017-10-23 DIAGNOSIS — Z0000 Encounter for general adult medical examination without abnormal findings: Secondary | ICD-10-CM

## 2017-10-23 DIAGNOSIS — Z79899 Other long term (current) drug therapy: Secondary | ICD-10-CM | POA: Diagnosis not present

## 2017-10-23 DIAGNOSIS — F902 Attention-deficit hyperactivity disorder, combined type: Secondary | ICD-10-CM

## 2017-10-23 DIAGNOSIS — E538 Deficiency of other specified B group vitamins: Secondary | ICD-10-CM | POA: Insufficient documentation

## 2017-10-23 LAB — COMPREHENSIVE METABOLIC PANEL WITH GFR
ALT: 22 U/L (ref 0–53)
AST: 17 U/L (ref 0–37)
Albumin: 4.6 g/dL (ref 3.5–5.2)
Alkaline Phosphatase: 63 U/L (ref 39–117)
BUN: 12 mg/dL (ref 6–23)
CO2: 30 meq/L (ref 19–32)
Calcium: 9.7 mg/dL (ref 8.4–10.5)
Chloride: 104 meq/L (ref 96–112)
Creatinine, Ser: 0.98 mg/dL (ref 0.40–1.50)
GFR: 88.95 mL/min
Glucose, Bld: 89 mg/dL (ref 70–99)
Potassium: 4.2 meq/L (ref 3.5–5.1)
Sodium: 139 meq/L (ref 135–145)
Total Bilirubin: 1.3 mg/dL — ABNORMAL HIGH (ref 0.2–1.2)
Total Protein: 7.2 g/dL (ref 6.0–8.3)

## 2017-10-23 LAB — CBC
HCT: 44.2 % (ref 39.0–52.0)
Hemoglobin: 15.8 g/dL (ref 13.0–17.0)
MCHC: 35.8 g/dL (ref 30.0–36.0)
MCV: 87.2 fl (ref 78.0–100.0)
Platelets: 168 K/uL (ref 150.0–400.0)
RBC: 5.07 Mil/uL (ref 4.22–5.81)
RDW: 14.4 % (ref 11.5–15.5)
WBC: 5.2 K/uL (ref 4.0–10.5)

## 2017-10-23 LAB — VITAMIN B12: Vitamin B-12: 376 pg/mL (ref 211–911)

## 2017-10-23 LAB — TSH: TSH: 4.64 u[IU]/mL — ABNORMAL HIGH (ref 0.35–4.50)

## 2017-10-23 LAB — T4, FREE: Free T4: 0.81 ng/dL (ref 0.60–1.60)

## 2017-10-23 NOTE — Assessment & Plan Note (Signed)
Using the med pretty much every day--mostly the 36mg 

## 2017-10-23 NOTE — Assessment & Plan Note (Signed)
Seems euthyroid 

## 2017-10-23 NOTE — Assessment & Plan Note (Signed)
Healthy Feels better gaining back some weight--discussed fitness and maintaining (and slow decline again) Yearly flu vaccine

## 2017-10-23 NOTE — Assessment & Plan Note (Signed)
Sleeps well with the CPAP 

## 2017-10-23 NOTE — Progress Notes (Signed)
Patient ID: Micheal [name], male    DOB: 01/01/1976, 42 y.o.   MRN: [id number]  HPI Here for physical  No new concerns Is feeling some better---not so achy lately Has liberalized his diet a bit, and increased his exercise Weight back up a little  Stopped the B12 No more abnormal sensations in his feet Is on MVI now  Wife still feels he needs higher dose of the medication Using 2 on many days if working, 1 on other days Still works night shift in ER  Sleeps with CPAP  Usually does okay---unless his nose is congested  Current Outpatient Medications on File Prior to Visit  Medication Sig Dispense Refill  . acetaminophen (TYLENOL) 500 MG tablet Take 500 mg by mouth every 6 (six) hours as needed.    Marland Kitchen. levothyroxine (SYNTHROID, LEVOTHROID) 50 MCG tablet TAKE 1/2 TABLET BY MOUTH DAILY BEFORE BREAKFAST. 60 tablet 2  . methylphenidate 18 MG PO CR tablet Take 1-2 tablets (18-36 mg total) by mouth daily. 60 tablet 0  . Multiple Vitamin (MULTIVITAMIN) tablet Take 1 tablet by mouth daily.    . naproxen sodium (ALEVE) 220 MG tablet Take 220 mg by mouth.     No current facility-administered medications on file prior to visit.     Allergies  Allergen Reactions  . Calcium Channel Blockers     Chest discomfort; negative EKG  . Hctz [Hydrochlorothiazide]     cramps    Past Medical History:  Diagnosis Date  . ADHD (attention deficit hyperactivity disorder), combined type   . Gilbert syndrome 2005   total bilirubin  1.8  . Hypothyroidism 2005   free T4 0.8; TSH 5.77  . Nonspecific elevation of levels of transaminase or lactic acid dehydrogenase (LDH) 2006    ALT 51  . Vitamin B12 deficiency     Past Surgical History:  Procedure Laterality Date  . WISDOM TOOTH EXTRACTION     as teen    Family History  Problem Relation Age of Onset  . Hypertension Mother   . Skin cancer Mother   . Deep vein thrombosis Sister 18       BCP & smoker  . Lung cancer Paternal Grandfather          smoker  . Lung cancer Paternal Grandmother        2nd hand smoke exposure   . Lung cancer Maternal Grandmother        smoker  . Transient ischemic attack Maternal Grandmother   . Pancreatic cancer Maternal Grandfather   . Diabetes Unknown        GGM  . Heart disease Neg Hx     Social History   Socioeconomic History  . Marital status: Married    Spouse name: Not on file  . Number of children: 4  . Years of education: Not on file  . Highest education level: Not on file  Social Needs  . Financial resource strain: Not on file  . Food insecurity - worry: Not on file  . Food insecurity - inability: Not on file  . Transportation needs - medical: Not on file  . Transportation needs - non-medical: Not on file  Occupational History  . Occupation: ER Set designertechnician    Employer: [employer]    Comment: [employer]  Tobacco Use  . Smoking status: Never Smoker  . Smokeless tobacco: Never Used  Substance and Sexual Activity  . Alcohol use: Yes    Comment:  Rarely  . Drug use: No  . Sexual activity: Not on file  Other Topics Concern  . Not on file  Social History Narrative   1 son from previous marriage   2 stepchildren   1 son from current marriage   Review of Systems  Constitutional: Negative for fatigue.       Wears seat belt  HENT: Negative for hearing loss and tinnitus.        Keeps up with dentist---recent fillings and crown  Eyes: Negative for visual disturbance.       No diplopia or unilateral vision loss  Respiratory: Negative for cough, chest tightness and shortness of breath.   Cardiovascular: Negative for chest pain, palpitations and leg swelling.  Gastrointestinal: Negative for abdominal pain, blood in stool and constipation.       No heartburn  Genitourinary: Negative for urgency.       Slow stream in AM for a while--better now. Has occasional dribbling No sexual problems  Musculoskeletal: Negative for arthralgias, back pain and joint swelling.   Skin: Negative for rash.       No suspicious lesions  Allergic/Immunologic: Negative for environmental allergies and immunocompromised state.  Neurological: Negative for dizziness, syncope and light-headedness.       Occasional headaches--mild  Hematological: Negative for adenopathy. Does not bruise/bleed easily.  Psychiatric/Behavioral: Negative for dysphoric mood and sleep disturbance. The patient is not nervous/anxious.        Objective:   Physical Exam  Constitutional: He is oriented to person, place, and time. He appears well-developed. No distress.  HENT:  Head: Normocephalic and atraumatic.  Right Ear: External ear normal.  Left Ear: External ear normal.  Mouth/Throat: Oropharynx is clear and moist. No oropharyngeal exudate.  Eyes: Conjunctivae are normal. Pupils are equal, round, and reactive to light.  Neck: No thyromegaly present.  Cardiovascular: Normal rate, regular rhythm, normal heart sounds and intact distal pulses. Exam reveals no gallop.  No murmur heard. Pulmonary/Chest: Effort normal and breath sounds normal. No respiratory distress. He has no wheezes. He has no rales.  Abdominal: Soft. There is no tenderness.  Musculoskeletal: He exhibits no edema or tenderness.  Lymphadenopathy:    He has no cervical adenopathy.  Neurological: He is alert and oriented to person, place, and time.  Skin: No rash noted. No erythema.  Psychiatric: He has a normal mood and affect.          Assessment & Plan:

## 2017-10-24 LAB — PAIN MGMT, PROFILE 8 W/CONF, U
6 ACETYLMORPHINE: NEGATIVE ng/mL (ref <10)
ALCOHOL METABOLITES: NEGATIVE ng/mL (ref <500)
AMPHETAMINES: NEGATIVE ng/mL (ref <500)
Benzodiazepines: NEGATIVE ng/mL (ref <100)
Buprenorphine, Urine: NEGATIVE ng/mL (ref <5)
COCAINE METABOLITE: NEGATIVE ng/mL (ref <150)
CREATININE: 85.5 mg/dL
MARIJUANA METABOLITE: NEGATIVE ng/mL (ref <20)
MDMA: NEGATIVE ng/mL (ref <500)
OPIATES: NEGATIVE ng/mL (ref <100)
OXIDANT: NEGATIVE ug/mL (ref <200)
Oxycodone: NEGATIVE ng/mL (ref <100)
pH: 6.5 (ref 4.5–9.0)

## 2017-11-08 ENCOUNTER — Other Ambulatory Visit: Payer: Self-pay | Admitting: Internal Medicine

## 2017-11-08 MED ORDER — METHYLPHENIDATE HCL ER (OSM) 18 MG PO TBCR: 18.0000 mg | EXTENDED_RELEASE_TABLET | Freq: Every day | ORAL | 0 refills | Status: DC

## 2017-11-08 MED FILL — CONCERTA 18 MG TABLET ER: 18 | 30 days supply | Qty: 60 | Fill #0

## 2017-11-08 NOTE — Telephone Encounter (Signed)
Last filled 09-25-17 #60 Last OV 10-25-17 Next OV 10-23-17

## 2017-11-27 ENCOUNTER — Telehealth: Payer: Self-pay | Admitting: Internal Medicine

## 2017-11-27 NOTE — Telephone Encounter (Signed)
Pt dropped off vaccine/physical history to be completed for school. Pt is coming for TB skin test on 4/9. Placed in Rx tower.

## 2017-11-27 NOTE — Telephone Encounter (Signed)
He is also short 1 hepatitis B---and he should get this

## 2017-11-27 NOTE — Telephone Encounter (Signed)
[facility], he received a regular tetanus vaccine in 2011 not a TDAP. Looks like he will need respiration, vision, hearing, and a UA to complete the physical part

## 2017-11-28 NOTE — Telephone Encounter (Signed)
Spoke to pt. He said [facility] did a Hep B titer and a possible injection. He will try to get that information from [facility]. He needs a Tdap while he is here on the 9th.

## 2017-11-29 NOTE — Telephone Encounter (Signed)
Pt's wife dropped off record. Placed in rx tower.

## 2017-12-05 ENCOUNTER — Ambulatory Visit (INDEPENDENT_AMBULATORY_CARE_PROVIDER_SITE_OTHER): Payer: 59

## 2017-12-05 DIAGNOSIS — Z139 Encounter for screening, unspecified: Secondary | ICD-10-CM | POA: Diagnosis not present

## 2017-12-05 DIAGNOSIS — Z23 Encounter for immunization: Secondary | ICD-10-CM

## 2017-12-05 DIAGNOSIS — Z111 Encounter for screening for respiratory tuberculosis: Secondary | ICD-10-CM

## 2017-12-05 LAB — POC URINALSYSI DIPSTICK (AUTOMATED)
Bilirubin, UA: NEGATIVE
Blood, UA: NEGATIVE
Glucose, UA: NEGATIVE
Ketones, UA: NEGATIVE
LEUKOCYTES UA: NEGATIVE
Nitrite, UA: NEGATIVE
PROTEIN UA: NEGATIVE
SPEC GRAV UA: 1.01 (ref 1.010–1.025)
UROBILINOGEN UA: 0.2 U/dL
pH, UA: 8 (ref 5.0–8.0)

## 2017-12-07 ENCOUNTER — Telehealth: Payer: Self-pay | Admitting: Internal Medicine

## 2017-12-07 ENCOUNTER — Encounter: Payer: Self-pay | Admitting: Internal Medicine

## 2017-12-07 NOTE — Telephone Encounter (Signed)
I left a message for patient to call me back. I put in a CRM.

## 2017-12-07 NOTE — Telephone Encounter (Signed)
Pt stopped in concerning a lab charge for pain management/toxicology. He contacted [facility] and was told he needs to provide medical records showing medication is medically necessary and they will refile and it may be covered. Pt is requesting a letter stating medication is medically necessary.

## 2017-12-07 NOTE — Telephone Encounter (Signed)
I wrote a letter Find out how he wants it sent No charge

## 2017-12-08 ENCOUNTER — Other Ambulatory Visit (INDEPENDENT_AMBULATORY_CARE_PROVIDER_SITE_OTHER): Payer: 59

## 2017-12-08 DIAGNOSIS — Z0184 Encounter for antibody response examination: Secondary | ICD-10-CM | POA: Diagnosis not present

## 2017-12-08 NOTE — Addendum Note (Signed)
Addended by: Alvina ChouWALSH, TERRI J on: 12/08/2017 04:27 PM   Modules accepted: Orders

## 2017-12-08 NOTE — Telephone Encounter (Signed)
I have to leave for a meeting at 130 today. Not sure what time he plans on coming today. I will leave the paperwork on my desk in case someone else reads his PPD and can give him the paperwork.

## 2017-12-08 NOTE — Telephone Encounter (Signed)
Patient didn't return my call.  [facility] to give to patient when he comes in today to have his TB test read.

## 2017-12-11 LAB — VARICELLA ZOSTER ANTIBODY, IGG: Varicella IgG: 3064 index

## 2017-12-25 ENCOUNTER — Other Ambulatory Visit: Payer: Self-pay | Admitting: Internal Medicine

## 2017-12-26 MED ORDER — METHYLPHENIDATE HCL ER (OSM) 18 MG PO TBCR: 18.0000 mg | EXTENDED_RELEASE_TABLET | Freq: Every day | ORAL | 0 refills | Status: DC

## 2017-12-26 NOTE — Telephone Encounter (Signed)
Last written 11-08-17 #60 Last OV 10-23-17 Next OV 10-25-18

## 2017-12-27 ENCOUNTER — Telehealth: Payer: Self-pay

## 2017-12-27 NOTE — Telephone Encounter (Signed)
PA form filled out and faxed back to [employer]. Waiting on response.

## 2017-12-29 MED FILL — CONCERTA 18 MG TABLET ER: 18 | 30 days supply | Qty: 60 | Fill #0

## 2018-01-01 NOTE — Telephone Encounter (Signed)
PA approved from 12-29-17 to 12-29-2018

## 2018-01-23 ENCOUNTER — Telehealth: Payer: Self-pay | Admitting: Internal Medicine

## 2018-01-23 NOTE — Telephone Encounter (Signed)
Pt dropped off [employer] forms to be filled out In dr Alphonsus Sias rx tower up front

## 2018-01-25 MED FILL — LEVOTHYROXINE 50 MCG TABLET: 50 | 90 days supply | Qty: 45 | Fill #1

## 2018-01-25 NOTE — Telephone Encounter (Signed)
Forms placed in Dr Karle Starch inbox on his desk. I could not find a result for the PPD done on 12-05-17.

## 2018-01-26 NOTE — Telephone Encounter (Signed)
Spoke to pt. He said he will do the 2-step PPD. The last one was read, but not documented. They require them to be 2 weeks apart on the paper, so he would need a new set. He is going to call the school to find out about the [place] since there is documentation of 2 MMR vaccines.

## 2018-01-26 NOTE — Telephone Encounter (Signed)
Was the PPD every read in April? If not he needs to come back or he can get quantiferon Not sure he needs rubella titer still--- since he had 2 documented MMRs Okay to add that

## 2018-01-30 ENCOUNTER — Ambulatory Visit (INDEPENDENT_AMBULATORY_CARE_PROVIDER_SITE_OTHER): Payer: 59 | Admitting: *Deleted

## 2018-01-30 DIAGNOSIS — Z111 Encounter for screening for respiratory tuberculosis: Secondary | ICD-10-CM

## 2018-02-01 LAB — TB SKIN TEST: TB Skin Test: NEGATIVE

## 2018-02-06 ENCOUNTER — Other Ambulatory Visit: Payer: Self-pay | Admitting: Internal Medicine

## 2018-02-06 DIAGNOSIS — Z209 Contact with and (suspected) exposure to unspecified communicable disease: Secondary | ICD-10-CM

## 2018-02-07 ENCOUNTER — Ambulatory Visit: Payer: 59

## 2018-02-07 ENCOUNTER — Other Ambulatory Visit (INDEPENDENT_AMBULATORY_CARE_PROVIDER_SITE_OTHER): Payer: 59

## 2018-02-07 DIAGNOSIS — Z209 Contact with and (suspected) exposure to unspecified communicable disease: Secondary | ICD-10-CM | POA: Diagnosis not present

## 2018-02-07 DIAGNOSIS — Z0279 Encounter for issue of other medical certificate: Secondary | ICD-10-CM

## 2018-02-08 LAB — RUBELLA SCREEN: Rubella: 5.84 index

## 2018-02-13 ENCOUNTER — Ambulatory Visit (INDEPENDENT_AMBULATORY_CARE_PROVIDER_SITE_OTHER): Payer: 59 | Admitting: *Deleted

## 2018-02-13 ENCOUNTER — Other Ambulatory Visit: Payer: Self-pay | Admitting: Internal Medicine

## 2018-02-13 DIAGNOSIS — Z111 Encounter for screening for respiratory tuberculosis: Secondary | ICD-10-CM | POA: Diagnosis not present

## 2018-02-13 MED ORDER — METHYLPHENIDATE HCL ER (OSM) 18 MG PO TBCR: 18.0000 mg | EXTENDED_RELEASE_TABLET | Freq: Every day | ORAL | 0 refills | Status: DC

## 2018-02-13 MED FILL — CONCERTA 18 MG TABLET ER: 18 | 30 days supply | Qty: 60 | Fill #0

## 2018-02-13 NOTE — Telephone Encounter (Signed)
Last filled 12-26-17 #60 Last OV 10-23-17 Next OV 10-25-18

## 2018-02-13 NOTE — Progress Notes (Signed)
Per orders of Dr. Alphonsus SiasLetvak, injection of PPD given by Ileana LaddLoring, Donna Simpson. Patient tolerated injection well.

## 2018-02-15 LAB — TB SKIN TEST: TB SKIN TEST: NEGATIVE

## 2018-03-28 ENCOUNTER — Other Ambulatory Visit: Payer: Self-pay | Admitting: Internal Medicine

## 2018-03-28 MED ORDER — METHYLPHENIDATE HCL ER (OSM) 18 MG PO TBCR: 18.0000 mg | EXTENDED_RELEASE_TABLET | Freq: Every day | ORAL | 0 refills | Status: DC

## 2018-03-28 MED FILL — CONCERTA 18 MG TABLET ER: 18 | 30 days supply | Qty: 60 | Fill #0

## 2018-03-28 NOTE — Telephone Encounter (Signed)
Last filled 02-13-18 #60 Last OV 10-23-17 Next OV 10-25-18  [facility]

## 2018-05-01 ENCOUNTER — Other Ambulatory Visit: Payer: Self-pay

## 2018-05-01 MED ORDER — METHYLPHENIDATE HCL ER (OSM) 18 MG PO TBCR: 18.0000 mg | EXTENDED_RELEASE_TABLET | Freq: Every day | ORAL | 0 refills | Status: DC

## 2018-05-01 MED FILL — LEVOTHYROXINE 50 MCG TABLET: 50 | 90 days supply | Qty: 45 | Fill #2

## 2018-05-01 MED FILL — CONCERTA 18 MG TABLET ER: 18 | 30 days supply | Qty: 60 | Fill #0

## 2018-05-01 NOTE — Telephone Encounter (Signed)
Last filled 03-28-18 #60 Last OV 10-23-17 Next OV 10-25-18

## 2018-05-24 ENCOUNTER — Telehealth: Payer: Self-pay

## 2018-05-24 NOTE — Telephone Encounter (Signed)
Copied from CRM [id number]. Topic: General - Other >> May 24, 2018  3:19 PM Ronney Lion A wrote: Reason for CRM: Patient called in wanting to speak with an office manager, he didn't really give me information as to what it was for. He just would like a  Production designer, theatre/television/film to reach out to him.

## 2018-05-24 NOTE — Telephone Encounter (Signed)
I spoke with pt and having an issue with scheduling an appt. Micheal [name] thinks something is wrong with the son's acct. Micheal [name] does not want to wait until [name] front office mgr return on 05/29/18. Micheal [name] wants son Micheal [name] DOB 12/04/10 to have flu shot today. I spoke with Micheal Chessman RN and she said yes Micheal [name] can get flu shot today and Micheal [name] or [name] will meet Micheal [name] and Micheal [name] at [facility]. On their way now. Can also schedule wcc when at office. Micheal [name] voiced understanding and is appreciative. FYI to [name] RN Clinical mgr.

## 2018-05-25 NOTE — Telephone Encounter (Signed)
Noted, thanks!

## 2018-06-07 ENCOUNTER — Other Ambulatory Visit: Payer: Self-pay

## 2018-06-07 MED ORDER — METHYLPHENIDATE HCL ER (OSM) 18 MG PO TBCR: 18.0000 mg | EXTENDED_RELEASE_TABLET | Freq: Every day | ORAL | 0 refills | Status: DC

## 2018-06-07 MED FILL — CONCERTA 18 MG TABLET ER: 18 | 30 days supply | Qty: 60 | Fill #0

## 2018-06-07 NOTE — Telephone Encounter (Signed)
Last filled 05-01-18 Last OV 10-23-17  Next OV 10-25-18

## 2018-07-30 ENCOUNTER — Other Ambulatory Visit: Payer: Self-pay

## 2018-07-30 MED ORDER — METHYLPHENIDATE HCL ER (OSM) 18 MG PO TBCR: 18.0000 mg | EXTENDED_RELEASE_TABLET | Freq: Every day | ORAL | 0 refills | Status: DC

## 2018-07-30 MED FILL — CONCERTA 18 MG TABLET ER: 18 | 30 days supply | Qty: 60 | Fill #0

## 2018-07-30 NOTE — Telephone Encounter (Signed)
Last filled 06-07-18 #60 Last OV 10-23-17 Next OV 10-25-18 [facility]  [place]

## 2018-08-02 ENCOUNTER — Ambulatory Visit (INDEPENDENT_AMBULATORY_CARE_PROVIDER_SITE_OTHER): Payer: 59 | Admitting: Internal Medicine

## 2018-08-02 ENCOUNTER — Encounter: Payer: Self-pay | Admitting: Internal Medicine

## 2018-08-02 VITALS — BP 132/84 | HR 80 | Temp 98.5°F | Ht 71.0 in | Wt 235.0 lb

## 2018-08-02 DIAGNOSIS — H6982 Other specified disorders of Eustachian tube, left ear: Secondary | ICD-10-CM

## 2018-08-02 MED ORDER — PREDNISONE 20 MG PO TABS: 40.0000 mg | ORAL_TABLET | Freq: Every day | ORAL | 0 refills | Status: DC

## 2018-08-02 NOTE — Progress Notes (Signed)
Subjective:    Patient ID: Micheal [name], male    DOB: 12/07/1975, 42 y.o.   MRN: [id number]  HPI Here due to ear pressure Has chronic intermittent ear popping--since childhood Thought to have Eustachian tube dysfunction Rarely needs decongestant and has done well  Something is different in the left side Feels it "inside" Nothing seen at work (has had people check: Has tried decongestant Has sense of rattle or paper crackling in there Goes back 10 days or so  Now with itchy sticky stuff in right > left eye Felt like stye was forming--but never did On outside of eyes  Current Outpatient Medications on File Prior to Visit  Medication Sig Dispense Refill  . acetaminophen (TYLENOL) 500 MG tablet Take 500 mg by mouth every 6 (six) hours as needed.    Marland Kitchen. levothyroxine (SYNTHROID, LEVOTHROID) 50 MCG tablet TAKE 1/2 TABLET BY MOUTH DAILY BEFORE BREAKFAST. 60 tablet 2  . methylphenidate 18 MG PO CR tablet Take 1-2 tablets (18-36 mg total) by mouth daily. 60 tablet 0  . Multiple Vitamin (MULTIVITAMIN) tablet Take 1 tablet by mouth daily.    . naproxen sodium (ALEVE) 220 MG tablet Take 220 mg by mouth.     No current facility-administered medications on file prior to visit.     Allergies  Allergen Reactions  . Calcium Channel Blockers     Chest discomfort; negative EKG  . Hctz [Hydrochlorothiazide]     cramps    Past Medical History:  Diagnosis Date  . ADHD (attention deficit hyperactivity disorder), combined type   . Gilbert syndrome 2005   total bilirubin  1.8  . Hypothyroidism 2005   free T4 0.8; TSH 5.77  . Nonspecific elevation of levels of transaminase or lactic acid dehydrogenase (LDH) 2006    ALT 51  . Vitamin B12 deficiency     Past Surgical History:  Procedure Laterality Date  . WISDOM TOOTH EXTRACTION     as teen    Family History  Problem Relation Age of Onset  . Hypertension Mother   . Skin cancer Mother   . Deep vein thrombosis Sister 18       BCP  & smoker  . Lung cancer Paternal Grandfather        smoker  . Lung cancer Paternal Grandmother        2nd hand smoke exposure   . Lung cancer Maternal Grandmother        smoker  . Transient ischemic attack Maternal Grandmother   . Pancreatic cancer Maternal Grandfather   . Diabetes Unknown        GGM  . Heart disease Neg Hx     Social History   Socioeconomic History  . Marital status: Married    Spouse name: Not on file  . Number of children: 4  . Years of education: Not on file  . Highest education level: Not on file  Occupational History  . Occupation: ER Set designertechnician    Employer: [employer]    Comment: [employer] [place]  Social Needs  . Financial resource strain: Not on file  . Food insecurity:    Worry: Not on file    Inability: Not on file  . Transportation needs:    Medical: Not on file    Non-medical: Not on file  Tobacco Use  . Smoking status: Never Smoker  . Smokeless tobacco: Never Used  Substance and Sexual Activity  . Alcohol use: Yes    Comment: Rarely  .  Drug use: No  . Sexual activity: Not on file  Lifestyle  . Physical activity:    Days per week: Not on file    Minutes per session: Not on file  . Stress: Not on file  Relationships  . Social connections:    Talks on phone: Not on file    Gets together: Not on file    Attends religious service: Not on file    Active member of club or organization: Not on file    Attends meetings of clubs or organizations: Not on file    Relationship status: Not on file  . Intimate partner violence:    Fear of current or ex partner: Not on file    Emotionally abused: Not on file    Physically abused: Not on file    Forced sexual activity: Not on file  Other Topics Concern  . Not on file  Social History Narrative   1 son from previous marriage   2 stepchildren   1 son from current marriage    Review of Systems Hearing seems to be the same as usual No vertigo No fever Doesn't feel sick      Objective:   Physical Exam  HENT:   No sinus tenderness TMs normal without inflammation, bulging, etc  Eyes: Pupils are equal, round, and reactive to light. Conjunctivae are normal. Right eye exhibits no discharge. Left eye exhibits no discharge.           Assessment & Plan:

## 2018-08-02 NOTE — Assessment & Plan Note (Signed)
No infection and is not ill Symptoms are mild for now Will give Rx for prednisone just in case

## 2018-08-06 MED FILL — predniSONE 20 MG TABS: 20 | 3 days supply | Qty: 6 | Fill #0

## 2018-09-03 ENCOUNTER — Other Ambulatory Visit: Payer: Self-pay | Admitting: Internal Medicine

## 2018-09-03 MED FILL — LEVOTHYROXINE 50 MCG TABLET: 50 | 90 days supply | Qty: 45 | Fill #0

## 2018-09-26 ENCOUNTER — Ambulatory Visit (INDEPENDENT_AMBULATORY_CARE_PROVIDER_SITE_OTHER): Payer: 59 | Admitting: Internal Medicine

## 2018-09-26 ENCOUNTER — Encounter: Payer: Self-pay | Admitting: Internal Medicine

## 2018-09-26 VITALS — BP 132/84 | HR 74 | Temp 98.2°F | Wt 233.0 lb

## 2018-09-26 DIAGNOSIS — R1013 Epigastric pain: Secondary | ICD-10-CM | POA: Diagnosis not present

## 2018-09-26 DIAGNOSIS — R141 Gas pain: Secondary | ICD-10-CM | POA: Diagnosis not present

## 2018-09-26 DIAGNOSIS — R140 Abdominal distension (gaseous): Secondary | ICD-10-CM

## 2018-09-26 MED ORDER — OMEPRAZOLE 20 MG PO CPDR: 20.0000 mg | DELAYED_RELEASE_CAPSULE | Freq: Every day | ORAL | 0 refills | Status: AC

## 2018-09-26 NOTE — Patient Instructions (Signed)

## 2018-09-26 NOTE — Progress Notes (Signed)
Subjective:    Patient ID: Micheal [name], male    DOB: February 21, 1976, 43 y.o.   MRN: [id number]  HPI  Pt presents to the clinic today with c/o abdominal pain. He reports this started 3-4 days ago. The pain is in the upper abdomen. He describes the pain as a constant dull ache. He reports pain started after eating a salad at lunch Monday. Pain resolved by Tuesday morning. He reports pain returned Tuesday evening after eating supper. He reports associated burping and gas. He denies nausea, vomiting, constipation or diarrhea. His bowels are moving normally. He has tried Tums, Gas X, Pepto Bismol and Tylenol. He has a history of IBS but he reports this is currently not an issue. He has never had a colonoscopy. He has no family history of diverticulitis. He does not take NSAID's consistently.  Review of Systems      Past Medical History:  Diagnosis Date  . ADHD (attention deficit hyperactivity disorder), combined type   . Gilbert syndrome 2005   total bilirubin  1.8  . Hypothyroidism 2005   free T4 0.8; TSH 5.77  . Nonspecific elevation of levels of transaminase or lactic acid dehydrogenase (LDH) 2006    ALT 51  . Vitamin B12 deficiency     Current Outpatient Medications  Medication Sig Dispense Refill  . acetaminophen (TYLENOL) 500 MG tablet Take 500 mg by mouth every 6 (six) hours as needed.    Marland Kitchen levothyroxine (SYNTHROID, LEVOTHROID) 50 MCG tablet TAKE 1/2 TABLET BY MOUTH DAILY BEFORE BREAKFAST. 60 tablet 1  . methylphenidate 18 MG PO CR tablet Take 1-2 tablets (18-36 mg total) by mouth daily. 60 tablet 0  . Multiple Vitamin (MULTIVITAMIN) tablet Take 1 tablet by mouth daily.    . naproxen sodium (ALEVE) 220 MG tablet Take 220 mg by mouth.    . predniSONE (DELTASONE) 20 MG tablet Take 2 tablets (40 mg total) by mouth daily. 6 tablet 0   No current facility-administered medications for this visit.     Allergies  Allergen Reactions  . Calcium Channel Blockers     Chest discomfort;  negative EKG  . Hctz [Hydrochlorothiazide]     cramps    Family History  Problem Relation Age of Onset  . Hypertension Mother   . Skin cancer Mother   . Deep vein thrombosis Sister 18       BCP & smoker  . Lung cancer Paternal Grandfather        smoker  . Lung cancer Paternal Grandmother        2nd hand smoke exposure   . Lung cancer Maternal Grandmother        smoker  . Transient ischemic attack Maternal Grandmother   . Pancreatic cancer Maternal Grandfather   . Diabetes Unknown        GGM  . Heart disease Neg Hx     Social History   Socioeconomic History  . Marital status: Married    Spouse name: Not on file  . Number of children: 4  . Years of education: Not on file  . Highest education level: Not on file  Occupational History  . 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 Financial resource strain: Not on file  . Food insecurity:    Worry: Not on file    Inability: Not on file  . Transportation needs:    Medical: Not on file  Non-medical: Not on file  Tobacco Use  . Smoking status: Never Smoker  . Smokeless tobacco: Never Used  Substance and Sexual Activity  . Alcohol use: Yes    Comment: Rarely  . Drug use: No  . Sexual activity: Not on file  Lifestyle  . Physical activity:    Days per week: Not on file    Minutes per session: Not on file  . Stress: Not on file  Relationships  . Social connections:    Talks on phone: Not on file    Gets together: Not on file    Attends religious service: Not on file    Active member of club or organization: Not on file    Attends meetings of clubs or organizations: Not on file    Relationship status: Not on file  . Intimate partner violence:    Fear of current or ex partner: Not on file    Emotionally abused: Not on file    Physically abused: Not on file    Forced sexual activity: Not on file  Other Topics Concern  . Not on file  Social History  Narrative   1 son from previous marriage   2 stepchildren   1 son from current marriage     Constitutional: Denies fever, malaise, fatigue, headache or abrupt weight changes.  Respiratory: Denies difficulty breathing, shortness of breath, cough or sputum production.   Cardiovascular: Denies chest pain, chest tightness, palpitations or swelling in the hands or feet.  Gastrointestinal: Pt reports abdominal pain, bloating, gas. Denies constipation, diarrhea or blood in the stool.  GU: Denies urgency, frequency, pain with urination, burning sensation, blood in urine, odor or discharge.  No other specific complaints in a complete review of systems (except as listed in HPI above).  Objective:   Physical Exam  BP 132/84   Pulse 74   Temp 98.2 F (36.8 C) (Oral)   Wt 233 lb (105.7 kg)   SpO2 98%   BMI 32.50 kg/m  Wt Readings from Last 3 Encounters:  09/26/18 233 lb (105.7 kg)  08/02/18 235 lb (106.6 kg)  10/23/17 218 lb 8 oz (99.1 kg)    General: Appears her stated age, well developed, well nourished in NAD. Cardiovascular: Normal rate and rhythm. S1,S2 noted.  No murmur, rubs or gallops noted.  Pulmonary/Chest: Normal effort and positive vesicular breath sounds. No respiratory distress. No wheezes, rales or ronchi noted.  Abdomen: Soft and mildly tender in the epigastric region. Normal bowel sounds. No distention or masses noted. Liver, spleen and kidneys non palpable.  Neurological: Alert and oriented.   BMET    Component Value Date/Time   NA 139 10/23/2017 1506   K 4.2 10/23/2017 1506   CL 104 10/23/2017 1506   CO2 30 10/23/2017 1506   GLUCOSE 89 10/23/2017 1506   BUN 12 10/23/2017 1506   CREATININE 0.98 10/23/2017 1506   CALCIUM 9.7 10/23/2017 1506    Lipid Panel     Component Value Date/Time   CHOL 114 10/20/2016 1640   TRIG 34.0 10/20/2016 1640   HDL 32.10 (L) 10/20/2016 1640   CHOLHDL 4 10/20/2016 1640   VLDL 6.8 10/20/2016 1640   LDLCALC 75 10/20/2016 1640      CBC    Component Value Date/Time   WBC 5.2 10/23/2017 1506   RBC 5.07 10/23/2017 1506   HGB 15.8 10/23/2017 1506   HCT 44.2 10/23/2017 1506   PLT 168.0 10/23/2017 1506   MCV 87.2 10/23/2017 1506  MCHC 35.8 10/23/2017 1506   RDW 14.4 10/23/2017 1506   LYMPHSABS 2.0 10/20/2016 1640   MONOABS 0.6 10/20/2016 1640   EOSABS 0.1 10/20/2016 1640   BASOSABS 0.0 10/20/2016 1640    Hgb A1C No results found for: HGBA1C          Assessment & Plan:   Epigastric Pain, Gas, Bloating:  Concern for gastritis vs GERD CMET, Amylase, Lipase and H Pylori today RX for Omeprazole 20 mg daily- 30 min before breakfast  Will folllow up after labs, return precautions discussed Nicki Reaperegina Adrick Kestler, NP

## 2018-09-27 LAB — COMPREHENSIVE METABOLIC PANEL
ALBUMIN: 4.5 g/dL (ref 3.5–5.2)
ALK PHOS: 59 U/L (ref 39–117)
ALT: 25 U/L (ref 0–53)
AST: 19 U/L (ref 0–37)
BUN: 10 mg/dL (ref 6–23)
CALCIUM: 9.3 mg/dL (ref 8.4–10.5)
CO2: 29 mEq/L (ref 19–32)
Chloride: 104 mEq/L (ref 96–112)
Creatinine, Ser: 1.03 mg/dL (ref 0.40–1.50)
GFR: 78.67 mL/min (ref >60.00)
Glucose, Bld: 90 mg/dL (ref 70–99)
POTASSIUM: 4 meq/L (ref 3.5–5.1)
Sodium: 139 mEq/L (ref 135–145)
TOTAL PROTEIN: 6.9 g/dL (ref 6.0–8.3)
Total Bilirubin: 1.4 mg/dL — ABNORMAL HIGH (ref 0.2–1.2)

## 2018-09-27 LAB — AMYLASE: AMYLASE: 38 U/L (ref 27–131)

## 2018-09-27 LAB — H. PYLORI ANTIBODY, IGG: H Pylori IgG: NEGATIVE

## 2018-09-27 LAB — LIPASE: LIPASE: 27 U/L (ref 11.0–59.0)

## 2018-10-25 ENCOUNTER — Ambulatory Visit (INDEPENDENT_AMBULATORY_CARE_PROVIDER_SITE_OTHER): Payer: 59 | Admitting: Internal Medicine

## 2018-10-25 ENCOUNTER — Encounter: Payer: Self-pay | Admitting: Internal Medicine

## 2018-10-25 VITALS — BP 140/90 | HR 76 | Temp 98.2°F | Ht 70.25 in | Wt 230.0 lb

## 2018-10-25 DIAGNOSIS — F902 Attention-deficit hyperactivity disorder, combined type: Secondary | ICD-10-CM

## 2018-10-25 DIAGNOSIS — E039 Hypothyroidism, unspecified: Secondary | ICD-10-CM | POA: Diagnosis not present

## 2018-10-25 DIAGNOSIS — G4733 Obstructive sleep apnea (adult) (pediatric): Secondary | ICD-10-CM | POA: Diagnosis not present

## 2018-10-25 DIAGNOSIS — Z0000 Encounter for general adult medical examination without abnormal findings: Secondary | ICD-10-CM | POA: Diagnosis not present

## 2018-10-25 NOTE — Progress Notes (Signed)
Subjective:    Patient ID: Micheal [name], male    DOB: Mar 22, 1976, 43 y.o.   MRN: [id number]  HPI Here for physical  Had abdominal pain after eating  Testing negative Did seem to get better after starting the omeprazole Never had similar problems He will try to wean off or stop the med  Working part time Full time school RN program at [employer] Finishes with AD 5/21 Uses ritalin for work---- some of the days for school  Still on the CPAP---uses most days Satisfied with this Better since he lost some of the weight  Current Outpatient Medications on File Prior to Visit  Medication Sig Dispense Refill  . acetaminophen (TYLENOL) 500 MG tablet Take 500 mg by mouth every 6 (six) hours as needed.    Marland Kitchen levothyroxine (SYNTHROID, LEVOTHROID) 50 MCG tablet TAKE 1/2 TABLET BY MOUTH DAILY BEFORE BREAKFAST. 60 tablet 1  . methylphenidate 18 MG PO CR tablet Take 1-2 tablets (18-36 mg total) by mouth daily. 60 tablet 0  . Multiple Vitamin (MULTIVITAMIN) tablet Take 1 tablet by mouth daily.    . naproxen sodium (ALEVE) 220 MG tablet Take 220 mg by mouth.    Marland Kitchen omeprazole (PRILOSEC) 20 MG capsule Take 1 capsule (20 mg total) by mouth daily. 30 capsule 0   No current facility-administered medications on file prior to visit.     Allergies  Allergen Reactions  . Calcium Channel Blockers     Chest discomfort; negative EKG  . Hctz [Hydrochlorothiazide]     cramps    Past Medical History:  Diagnosis Date  . ADHD (attention deficit hyperactivity disorder), combined type   . Gilbert syndrome 2005   total bilirubin  1.8  . Hypothyroidism 2005   free T4 0.8; TSH 5.77  . Nonspecific elevation of levels of transaminase or lactic acid dehydrogenase (LDH) 2006    ALT 51  . Vitamin B12 deficiency     Past Surgical History:  Procedure Laterality Date  . WISDOM TOOTH EXTRACTION     as teen    Family History  Problem Relation Age of Onset  . Hypertension Mother   . Skin cancer  Mother   . Deep vein thrombosis Sister 18       BCP & smoker  . Lung cancer Paternal Grandfather        smoker  . Lung cancer Paternal Grandmother        2nd hand smoke exposure   . Lung cancer Maternal Grandmother        smoker  . Transient ischemic attack Maternal Grandmother   . Pancreatic cancer Maternal Grandfather   . Diabetes Other        GGM  . Heart disease Neg Hx     Social History   Socioeconomic History  . Marital status: Married    Spouse name: Not on file  . Number of children: 4  . Years of education: Not on file  . Highest education level: Not on file  Occupational History  . 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 Financial resource strain: Not on file  . Food insecurity:    Worry: Not on file    Inability: Not on file  . Transportation needs:    Medical: Not on file    Non-medical: Not on file  Tobacco Use  . Smoking status: Never Smoker  . Smokeless tobacco: Never Used  Substance and Sexual Activity  . Alcohol use: Yes    Comment: Rarely  . Drug use: No  . Sexual activity: Not on file  Lifestyle  . Physical activity:    Days per week: Not on file    Minutes per session: Not on file  . Stress: Not on file  Relationships  . Social connections:    Talks on phone: Not on file    Gets together: Not on file    Attends religious service: Not on file    Active member of club or organization: Not on file    Attends meetings of clubs or organizations: Not on file    Relationship status: Not on file  . Intimate partner violence:    Fear of current or ex partner: Not on file    Emotionally abused: Not on file    Physically abused: Not on file    Forced sexual activity: Not on file  Other Topics Concern  . Not on file  Social History Narrative   1 son from previous marriage   2 stepchildren   1 son from current marriage   Review of Systems  Constitutional: Negative for fatigue.        Weight up a bit since last year Wears seat belt  HENT: Negative for dental problem, hearing loss and tinnitus.        Keeps up with the dentist  Eyes: Negative for visual disturbance.       No diplopia or unilateral vision loss  Respiratory: Negative for cough, chest tightness and shortness of breath.   Cardiovascular: Negative for chest pain, palpitations and leg swelling.  Gastrointestinal: Negative for blood in stool and constipation.       Rare heartburn  Endocrine: Negative for polydipsia and polyuria.  Genitourinary:       Slow at times in the morning  Slight dribbling No sexual problems  Musculoskeletal: Negative for arthralgias, back pain and joint swelling.  Skin: Negative for rash.  Allergic/Immunologic: Negative for environmental allergies and immunocompromised state.  Neurological: Negative for dizziness, syncope, light-headedness and headaches.  Hematological: Negative for adenopathy. Does not bruise/bleed easily.  Psychiatric/Behavioral: Negative for dysphoric mood. The patient is not nervous/anxious.        Objective:   Physical Exam  Constitutional: He is oriented to person, place, and time. He appears well-developed. No distress.  HENT:  Head: Normocephalic and atraumatic.  Right Ear: External ear normal.  Left Ear: External ear normal.  Mouth/Throat: Oropharynx is clear and moist. No oropharyngeal exudate.  Eyes: Pupils are equal, round, and reactive to light. Conjunctivae are normal.  Neck: No thyromegaly present.  Cardiovascular: Normal rate, regular rhythm, normal heart sounds and intact distal pulses. Exam reveals no gallop.  No murmur heard. Respiratory: Effort normal and breath sounds normal. No respiratory distress. He has no wheezes. He has no rales.  GI: Soft. There is no abdominal tenderness.  Musculoskeletal:        General: No tenderness or edema.  Lymphadenopathy:    He has no cervical adenopathy.  Neurological: He is alert and oriented to  person, place, and time.  Skin: No rash noted. No erythema.  Multiple benign nevi  Psychiatric: He has a normal mood and affect. His behavior is normal.           Assessment & Plan:

## 2018-10-25 NOTE — Assessment & Plan Note (Signed)
Euthyroid Will check labs 

## 2018-10-25 NOTE — Assessment & Plan Note (Signed)
Uses the med for work and sometimes for school

## 2018-10-25 NOTE — Assessment & Plan Note (Addendum)
Healthy Needs to watch weight and be careful about exercise (work/school limits time) Yearly flu vaccine Will try to wean off or stop the omeprazole

## 2018-10-25 NOTE — Assessment & Plan Note (Signed)
Uses the machine most nights

## 2018-10-26 LAB — CBC
HCT: 46.3 % (ref 39.0–52.0)
Hemoglobin: 16.4 g/dL (ref 13.0–17.0)
MCHC: 35.4 g/dL (ref 30.0–36.0)
MCV: 87.9 fl (ref 78.0–100.0)
Platelets: 183 10*3/uL (ref 150.0–400.0)
RBC: 5.27 Mil/uL (ref 4.22–5.81)
RDW: 13.9 % (ref 11.5–15.5)
WBC: 9 10*3/uL (ref 4.0–10.5)

## 2018-10-26 LAB — LIPID PANEL
Cholesterol: 159 mg/dL (ref 0–200)
HDL: 32.7 mg/dL — ABNORMAL LOW (ref >39.00)
LDL Cholesterol: 93 mg/dL (ref 0–99)
NonHDL: 126.19
Total CHOL/HDL Ratio: 5
Triglycerides: 167 mg/dL — ABNORMAL HIGH (ref 0.0–149.0)
VLDL: 33.4 mg/dL (ref 0.0–40.0)

## 2018-10-26 LAB — T4, FREE: Free T4: 0.8 ng/dL (ref 0.60–1.60)

## 2018-10-26 LAB — TSH: TSH: 6.72 u[IU]/mL — ABNORMAL HIGH (ref 0.35–4.50)

## 2018-10-27 ENCOUNTER — Other Ambulatory Visit: Payer: Self-pay | Admitting: Family Medicine

## 2018-10-27 MED ORDER — LEVOTHYROXINE SODIUM 50 MCG PO TABS: 50.0000 ug | ORAL_TABLET | Freq: Every day | ORAL | 1 refills | Status: DC

## 2018-10-31 ENCOUNTER — Telehealth: Payer: Self-pay | Admitting: Internal Medicine

## 2018-10-31 NOTE — Telephone Encounter (Signed)
I left a message on patient's voice mail to call back.  Patient needs to make a lab appointment in 2 months to recheck his thyroid.

## 2018-10-31 NOTE — Telephone Encounter (Signed)
I spoke to patient and he said he wants to wait for Dr.Letvak to get back before he increases his thyroid medication and schedules a lab appointment. Please call patient with Dr.Letvak's recommendations when he returns from vacation.

## 2018-11-05 NOTE — Telephone Encounter (Signed)
Left message on VM per DPR. 

## 2018-11-05 NOTE — Telephone Encounter (Signed)
Please let him know that his labs are about the same as usual----I don't think and changes are needed and if he feels okay, it is fine to just recheck labs at his next physical

## 2018-11-07 NOTE — Telephone Encounter (Addendum)
He said he is asking if he should increase his levothyroxine. He doesn't feel bad.   Also, asking if we can do a lab for strawberry allergy. He thinks he may be allergic to them.

## 2018-11-07 NOTE — Telephone Encounter (Signed)
Patient returned Shannon's call and said he did have questions.  Please call patient back at [phone].

## 2018-11-08 NOTE — Telephone Encounter (Signed)
I don't think he should change his medication. Can you check a food allergy panel to see if it includes strawberry? I would think it does. If he just has a minor reaction, should probably just avoid them and not test (if severe, we should test)

## 2018-11-08 NOTE — Telephone Encounter (Signed)
I looked at the [employer] and could not find one for strawberries. We would need to call them to see what they have. I did not look at [facility], yet.   Spoke to the pt. He said he will just avoid them and see what happens.

## 2019-02-04 ENCOUNTER — Telehealth: Payer: Self-pay | Admitting: Internal Medicine

## 2019-02-04 NOTE — Telephone Encounter (Signed)
Micheal [name], can he be placed on the Nurse Schedule tomorrow for his 1st PPD? He is contacting the school to find out which drug test he needs.

## 2019-02-04 NOTE — Telephone Encounter (Addendum)
Spoke to pt. He said he needs both for school. School does not tell him which drug test to have done. He fill find out.

## 2019-02-04 NOTE — Telephone Encounter (Signed)
Patient called into the office today to schedule a  TB Test and Drug screen.  Ok to schedule?

## 2019-02-04 NOTE — Telephone Encounter (Signed)
LM on VM asking patient to please call us back at his earliest convenience to get ppd and lab test scheduled.

## 2019-02-05 ENCOUNTER — Ambulatory Visit (INDEPENDENT_AMBULATORY_CARE_PROVIDER_SITE_OTHER): Payer: 59

## 2019-02-05 DIAGNOSIS — Z111 Encounter for screening for respiratory tuberculosis: Secondary | ICD-10-CM | POA: Diagnosis not present

## 2019-02-07 LAB — TB SKIN TEST
Induration: 0 mm
TB Skin Test: NEGATIVE

## 2019-02-21 ENCOUNTER — Other Ambulatory Visit: Payer: Self-pay

## 2019-02-21 MED ORDER — METHYLPHENIDATE HCL ER (OSM) 18 MG PO TBCR: 18.0000 mg | EXTENDED_RELEASE_TABLET | Freq: Every day | ORAL | 0 refills | Status: DC

## 2019-02-21 NOTE — Telephone Encounter (Signed)
Name of Medication: Methylphenidate 18 mg Name of Pharmacy: [facility] or Written Date and Quantity:# 109 on 07/30/18  Last Office Visit and Type:10/25/18 annual  Next Office Visit and Type: 10/31/2019 annual Last Controlled Substance Agreement Date: 12/01/2016 Last UDS:10/23/17

## 2019-02-22 ENCOUNTER — Telehealth: Payer: Self-pay

## 2019-02-22 NOTE — Telephone Encounter (Signed)
Did request on CoverMyMeds. It came back with a quantity exceeds limit. We have never had this issue. Will wait for the fax from them.

## 2019-02-26 MED FILL — CONCERTA 18 MG TABLET ER: 18 | 30 days supply | Qty: 60 | Fill #0

## 2019-06-12 ENCOUNTER — Other Ambulatory Visit: Payer: Self-pay | Admitting: Family Medicine

## 2019-06-22 ENCOUNTER — Other Ambulatory Visit: Payer: Self-pay

## 2019-06-24 MED ORDER — METHYLPHENIDATE HCL ER (OSM) 18 MG PO TBCR: 18.0000 mg | EXTENDED_RELEASE_TABLET | Freq: Every day | ORAL | 0 refills | Status: AC

## 2019-06-24 MED FILL — CONCERTA 18 MG TABLET ER: 18 | 30 days supply | Qty: 60 | Fill #0

## 2019-06-24 NOTE — Telephone Encounter (Signed)
Last written 02-21-19 #60 Last OV 10-25-18 Next OV 11-05-19 [place]

## 2019-09-09 MED FILL — CONCERTA 18 MG TABLET ER: 18 | 30 days supply | Qty: 60 | Fill #0

## 2019-09-11 MED FILL — CARVEDILOL 6.25 MG TABLET: 6.25 | 30 days supply | Qty: 60 | Fill #0

## 2019-09-12 MED FILL — LEVOTHYROXINE 75 MCG TABLET: 75 | 90 days supply | Qty: 90 | Fill #0

## 2019-10-14 MED FILL — CONCERTA 18 MG TABLET ER: 18 | 30 days supply | Qty: 60 | Fill #0

## 2019-10-14 MED FILL — CARVEDILOL 6.25 MG TABLET: 6.25 | 30 days supply | Qty: 60 | Fill #0

## 2019-10-31 ENCOUNTER — Encounter: Payer: 59 | Admitting: Internal Medicine

## 2019-11-05 ENCOUNTER — Encounter: Payer: 59 | Admitting: Internal Medicine

## 2019-11-12 MED FILL — CARVEDILOL 6.25 MG TABLET: 6.25 | 30 days supply | Qty: 60 | Fill #1

## 2019-11-12 MED FILL — CONCERTA 18 MG TABLET ER: 18 | 30 days supply | Qty: 60 | Fill #0

## 2019-12-25 MED FILL — CONCERTA 18 MG TABLET ER: 18 | 30 days supply | Qty: 60 | Fill #0

## 2019-12-27 MED FILL — CARVEDILOL 6.25 MG TABLET: 6.25 | 30 days supply | Qty: 60 | Fill #2

## 2019-12-27 MED FILL — LEVOTHYROXINE SODIUM 75 MCG: 75 | 90 days supply | Qty: 90 | Fill #1

## 2020-01-29 ENCOUNTER — Other Ambulatory Visit: Payer: Self-pay

## 2020-01-29 ENCOUNTER — Other Ambulatory Visit
Admission: RE | Admit: 2020-01-29 | Discharge: 2020-01-29 | Disposition: A | Discharge status: Home / Self Care | Payer: No Typology Code available for payment source | Source: Ambulatory Visit | Attending: Internal Medicine | Admitting: Internal Medicine

## 2020-01-29 DIAGNOSIS — Z01812 Encounter for preprocedural laboratory examination: Secondary | ICD-10-CM | POA: Insufficient documentation

## 2020-01-29 DIAGNOSIS — Z20822 Contact with and (suspected) exposure to covid-19: Secondary | ICD-10-CM | POA: Diagnosis not present

## 2020-01-29 LAB — SARS CORONAVIRUS 2 (TAT 6-24 HRS): SARS Coronavirus 2: NEGATIVE

## 2020-01-30 ENCOUNTER — Encounter: Payer: Self-pay | Admitting: Internal Medicine

## 2020-01-30 MED FILL — CONCERTA 18 MG TABLET ER: 18 | 30 days supply | Qty: 60 | Fill #0

## 2020-01-31 ENCOUNTER — Ambulatory Visit: Payer: No Typology Code available for payment source | Admitting: Certified Registered"

## 2020-01-31 ENCOUNTER — Encounter
Admission: RE | Disposition: A | Discharge status: Home / Self Care | Payer: Self-pay | Source: Home / Self Care | Attending: Internal Medicine

## 2020-01-31 ENCOUNTER — Encounter: Payer: Self-pay | Admitting: Internal Medicine

## 2020-01-31 ENCOUNTER — Other Ambulatory Visit: Payer: Self-pay

## 2020-01-31 ENCOUNTER — Ambulatory Visit
Admission: RE | Admit: 2020-01-31 | Discharge: 2020-01-31 | Disposition: A | Discharge status: Home / Self Care | Payer: No Typology Code available for payment source | Attending: Internal Medicine | Admitting: Internal Medicine

## 2020-01-31 DIAGNOSIS — R1310 Dysphagia, unspecified: Secondary | ICD-10-CM | POA: Insufficient documentation

## 2020-01-31 DIAGNOSIS — K269 Duodenal ulcer, unspecified as acute or chronic, without hemorrhage or perforation: Secondary | ICD-10-CM | POA: Insufficient documentation

## 2020-01-31 DIAGNOSIS — G4730 Sleep apnea, unspecified: Secondary | ICD-10-CM | POA: Diagnosis not present

## 2020-01-31 DIAGNOSIS — K2970 Gastritis, unspecified, without bleeding: Secondary | ICD-10-CM | POA: Diagnosis not present

## 2020-01-31 DIAGNOSIS — Z79890 Hormone replacement therapy (postmenopausal): Secondary | ICD-10-CM | POA: Insufficient documentation

## 2020-01-31 DIAGNOSIS — K3189 Other diseases of stomach and duodenum: Secondary | ICD-10-CM | POA: Insufficient documentation

## 2020-01-31 DIAGNOSIS — E039 Hypothyroidism, unspecified: Secondary | ICD-10-CM | POA: Insufficient documentation

## 2020-01-31 DIAGNOSIS — E538 Deficiency of other specified B group vitamins: Secondary | ICD-10-CM | POA: Insufficient documentation

## 2020-01-31 DIAGNOSIS — Z79899 Other long term (current) drug therapy: Secondary | ICD-10-CM | POA: Diagnosis not present

## 2020-01-31 DIAGNOSIS — F909 Attention-deficit hyperactivity disorder, unspecified type: Secondary | ICD-10-CM | POA: Diagnosis not present

## 2020-01-31 HISTORY — PX: ESOPHAGOGASTRODUODENOSCOPY (EGD) WITH PROPOFOL: SHX5813

## 2020-01-31 HISTORY — DX: Sleep apnea, unspecified: G47.30

## 2020-01-31 HISTORY — DX: Other specified behavioral and emotional disorders with onset usually occurring in childhood and adolescence: F98.8

## 2020-01-31 SURGERY — ESOPHAGOGASTRODUODENOSCOPY (EGD) WITH PROPOFOL
Anesthesia: General

## 2020-01-31 MED ORDER — GLYCOPYRROLATE 0.2 MG/ML IJ SOLN
INTRAMUSCULAR | Status: AC
  Filled 2020-01-31: qty 1

## 2020-01-31 MED ORDER — GLYCOPYRROLATE 0.2 MG/ML IJ SOLN
INTRAMUSCULAR | Status: DC | PRN
  Administered 2020-01-31: .2 mg via INTRAVENOUS

## 2020-01-31 MED ORDER — PROPOFOL 500 MG/50ML IV EMUL
INTRAVENOUS | Status: DC | PRN
  Administered 2020-01-31: 170 ug/kg/min via INTRAVENOUS

## 2020-01-31 MED ORDER — MIDAZOLAM HCL 2 MG/2ML IJ SOLN
INTRAMUSCULAR | Status: DC | PRN
  Administered 2020-01-31: 2 mg via INTRAVENOUS

## 2020-01-31 MED ORDER — MIDAZOLAM HCL 2 MG/2ML IJ SOLN
INTRAMUSCULAR | Status: AC
  Filled 2020-01-31: qty 2

## 2020-01-31 MED ORDER — PROPOFOL 10 MG/ML IV BOLUS
INTRAVENOUS | Status: DC | PRN
  Administered 2020-01-31: 10 mg via INTRAVENOUS
  Administered 2020-01-31: 30 mg via INTRAVENOUS

## 2020-01-31 MED ORDER — SODIUM CHLORIDE 0.9 % IV SOLN: INTRAVENOUS | Status: DC

## 2020-01-31 MED ORDER — LIDOCAINE HCL (CARDIAC) PF 100 MG/5ML IV SOSY
PREFILLED_SYRINGE | INTRAVENOUS | Status: DC | PRN
  Administered 2020-01-31: 100 mg via INTRAVENOUS

## 2020-01-31 NOTE — H&P (Signed)
Outpatient short stay form Pre-procedure 01/31/2020 1:22 PM Micheal [name], M.D.  Primary Physician: Micheal [name], M.D.  Reason for visit: Esophageal dysphagia  History of present illness:  Patient has dysphagia with possible GERD and dysphagia. No hemetemesis, weight loss, abdominal pain    Current Facility-Administered Medications:  .  0.9 %  sodium chloride infusion, , Intravenous, Continuous, Micheal [name], Micheal [name], MD  Medications Prior to Admission  Medication Sig Dispense Refill Last Dose  . acetaminophen (TYLENOL) 500 MG tablet Take 500 mg by mouth every 6 (six) hours as needed.   Past Week at Unknown time  . carvedilol (COREG) 6.25 MG tablet Take 6.25 mg by mouth 2 (two) times daily with a meal.   01/31/2020 at Unknown time  . levothyroxine (SYNTHROID) 50 MCG tablet TAKE 1 TABLET BY MOUTH ONCE DAILY BEFORE BREAKFAST 90 tablet 2 01/31/2020 at Unknown time  . methylphenidate 18 MG PO CR tablet Take 1-2 tablets (18-36 mg total) by mouth daily. 60 tablet 0 01/30/2020 at Unknown time  . Multiple Vitamin (MULTIVITAMIN) tablet Take 1 tablet by mouth daily.   Past Month at Unknown time  . naproxen sodium (ALEVE) 220 MG tablet Take 220 mg by mouth.   Past Month at Unknown time  . omeprazole (PRILOSEC) 20 MG capsule Take 1 capsule (20 mg total) by mouth daily. 30 capsule 0 Past Month at Unknown time     Allergies  Allergen Reactions  . Calcium Channel Blockers     Chest discomfort; negative EKG  . Hctz [Hydrochlorothiazide]     cramps     Past Medical History:  Diagnosis Date  . ADD (attention deficit disorder)   . ADHD (attention deficit hyperactivity disorder), combined type   . Gilbert syndrome 2005   total bilirubin  1.8  . Hypothyroidism 2005   free T4 0.8; TSH 5.77  . Nonspecific elevation of levels of transaminase or lactic acid dehydrogenase (LDH) 2006    ALT 51  . Sleep apnea   . Vitamin B12 deficiency     Review of systems:  Otherwise negative.    Physical  Exam  Gen: Alert, oriented. Appears stated age.  HEENT: [name]/AT. PERRLA. Lungs: CTA, no wheezes. CV: RR nl S1, S2. Abd: soft, benign, no masses. BS+ Ext: No edema. Pulses 2+    Planned procedures: Proceed with EGD. The patient understands the nature of the planned procedure, indications, risks, alternatives and potential complications including but not limited to bleeding, infection, perforation, damage to internal organs and possible oversedation/side effects from anesthesia. The patient agrees and gives consent to proceed.  Please refer to procedure notes for findings, recommendations and patient disposition/instructions.     Micheal [name], M.D. Gastroenterology 01/31/2020  1:22 PM

## 2020-01-31 NOTE — Op Note (Signed)
[facility] Patient Name: Micheal [name] Procedure Date: 01/31/2020 2:06 PM MRN: [id number] Account #: [id number] Date of Birth: [birth year] Admit Type: Outpatient Age: 44 Room: [facility] ENDO ROOM 3 Gender: Male Note Status: Finalized Procedure:             Upper GI endoscopy Indications:           Dysphagia, Suspected esophageal reflux Providers:             Benay Pike. Alice Reichert MD, MD Referring MD:          Ocie Cornfield. Ouida Sills MD, MD (Referring MD) Medicines:             Propofol per Anesthesia Complications:         No immediate complications. Procedure:             Pre-Anesthesia Assessment:                        - The risks and benefits of the procedure and the                         sedation options and risks were discussed with the                         patient. All questions were answered and informed                         consent was obtained.                        - Patient identification and proposed procedure were                         verified prior to the procedure by the nurse. The                         procedure was verified in the procedure room.                        - ASA Grade Assessment: III - A patient with severe                         systemic disease.                        - After reviewing the risks and benefits, the patient                         was deemed in satisfactory condition to undergo the                         procedure.                        After obtaining informed consent, the endoscope was                         passed under direct vision. Throughout the procedure,                         the patient's  blood pressure, pulse, and oxygen                         saturations were monitored continuously. The Endoscope                         was introduced through the mouth, and advanced to the                         third part of duodenum. The upper GI endoscopy was                          accomplished without difficulty. The patient tolerated                         the procedure well. Findings:      No endoscopic abnormality was evident in the esophagus to explain the       patient's complaint of dysphagia. It was decided, however, to proceed       with dilation in the distal esophagus. The scope was withdrawn. Dilation       was performed with a Maloney dilator with no resistance at 54 Fr.      Localized minimal inflammation characterized by erythema was found in       the gastric antrum. Biopsies were taken with a cold forceps for       Helicobacter pylori testing.      The cardia and gastric fundus were normal on retroflexion.      One non-bleeding superficial duodenal ulcer with no stigmata of bleeding       was found in the duodenal bulb. The lesion was 3 mm in largest dimension.      Multiple diffuse erosions without bleeding were found in the duodenal       bulb. Biopsies were taken with a cold forceps for histology.      The exam was otherwise without abnormality. Impression:            - No endoscopic esophageal abnormality to explain                         patient's dysphagia. Esophagus dilated. Dilated.                        - Gastritis. Biopsied.                        - Non-bleeding duodenal ulcer with no stigmata of                         bleeding.                        - Erosive duodenopathy without bleeding. Biopsied.                        - The examination was otherwise normal. Recommendation:        - Patient has a contact number available for                         emergencies. The signs and symptoms of potential  delayed complications were discussed with the patient.                         Return to normal activities tomorrow. Written                         discharge instructions were provided to the patient.                        - Resume previous diet.                        - Continue present medications.                         - Await pathology results.                        - Use Protonix (pantoprazole) 40 mg PO daily.                        - Consider possible globus hystericus as cause of                         intermittent dysphagia which is atypical and not                         related to meals or drinking excluisively.                        - Return to physician assistant in 6 weeks.                        - Follow up with Jacob Moores, PA-C in [ ]  months. Procedure Code(s):     --- Professional ---                        [phone], Esophagogastroduodenoscopy, flexible,                         transoral; with biopsy, single or multiple                        43450, Dilation of esophagus, by unguided sound or                         bougie, single or multiple passes Diagnosis Code(s):     --- Professional ---                        K31.89, Other diseases of stomach and duodenum                        K26.9, Duodenal ulcer, unspecified as acute or                         chronic, without hemorrhage or perforation                        K29.70, Gastritis, unspecified, without bleeding  R13.10, Dysphagia, unspecified CPT copyright 2019 American Medical Association. All rights reserved. The codes documented in this report are preliminary and upon coder review may  be revised to meet current compliance requirements. Stanton Kidney MD, MD 01/31/2020 2:35:49 PM This report has been signed electronically. Number of Addenda: 0 Note Initiated On: 01/31/2020 2:06 PM Estimated Blood Loss:  Estimated blood loss: none.      [facility]

## 2020-01-31 NOTE — Transfer of Care (Signed)
Immediate Anesthesia Transfer of Care Note  Patient: Micheal [name]  Procedure(s) Performed: ESOPHAGOGASTRODUODENOSCOPY (EGD) WITH PROPOFOL (N/A )  Patient Location: Endoscopy Unit  Anesthesia Type:General  Level of Consciousness: drowsy and patient cooperative  Airway & Oxygen Therapy: Patient Spontanous Breathing and Patient connected to face mask oxygen  Post-op Assessment: Report given to RN and Post -op Vital signs reviewed and stable  Post vital signs: Reviewed and stable  Last Vitals:  Vitals Value Taken Time  BP    Temp    Pulse    Resp    SpO2      Last Pain:  Vitals:   01/31/20 1319  TempSrc: Temporal  PainSc: 0-No pain         Complications: No apparent anesthesia complications

## 2020-01-31 NOTE — Anesthesia Preprocedure Evaluation (Signed)
Anesthesia Evaluation  Patient identified by MRN, date of birth, ID band Patient awake    Reviewed: Allergy & Precautions, H&P , NPO status , Patient's Chart, lab work & pertinent test results  History of Anesthesia Complications Negative for: history of anesthetic complications  Airway Mallampati: III  TM Distance: >3 FB Neck ROM: full    Dental  (+) Chipped   Pulmonary neg shortness of breath, sleep apnea and Continuous Positive Airway Pressure Ventilation ,    Pulmonary exam normal        Cardiovascular Exercise Tolerance: Good (-) angina(-) Past MI and (-) DOE negative cardio ROS Normal cardiovascular exam     Neuro/Psych PSYCHIATRIC DISORDERS negative neurological ROS     GI/Hepatic negative GI ROS, Neg liver ROS,   Endo/Other  Hypothyroidism   Renal/GU negative Renal ROS  negative genitourinary   Musculoskeletal   Abdominal   Peds  Hematology negative hematology ROS (+)   Anesthesia Other Findings Past Medical History: No date: ADD (attention deficit disorder) No date: ADHD (attention deficit hyperactivity disorder), combined  type 2005: Gilbert syndrome     Comment:  total bilirubin  1.8 2005: Hypothyroidism     Comment:  free T4 0.8; TSH 5.77 2006: Nonspecific elevation of levels of transaminase or lactic acid  dehydrogenase (LDH)     Comment:   ALT 51 No date: Sleep apnea No date: Vitamin B12 deficiency  Past Surgical History: No date: WISDOM TOOTH EXTRACTION     Comment:  as teen  BMI    Body Mass Index: 34.59 kg/m      Reproductive/Obstetrics negative OB ROS                             Anesthesia Physical Anesthesia Plan  ASA: III  Anesthesia Plan: General   Post-op Pain Management:    Induction: Intravenous  PONV Risk Score and Plan: Propofol infusion and TIVA  Airway Management Planned: Natural Airway and Nasal Cannula  Additional Equipment:    Intra-op Plan:   Post-operative Plan:   Informed Consent: I have reviewed the patients History and Physical, chart, labs and discussed the procedure including the risks, benefits and alternatives for the proposed anesthesia with the patient or authorized representative who has indicated his/her understanding and acceptance.     Dental Advisory Given  Plan Discussed with: Anesthesiologist, CRNA and Surgeon  Anesthesia Plan Comments: (Patient consented for risks of anesthesia including but not limited to:  - adverse reactions to medications - risk of intubation if required - damage to eyes, teeth, lips or other oral mucosa - nerve damage due to positioning  - sore throat or hoarseness - Damage to heart, brain, nerves, lungs, other parts of body or loss of life  Patient voiced understanding.)        Anesthesia Quick Evaluation

## 2020-01-31 NOTE — Anesthesia Procedure Notes (Signed)
Procedure Name: General with mask airway Performed by: Fletcher-Harrison, Antavius Sperbeck, CRNA Pre-anesthesia Checklist: Patient identified, Emergency Drugs available, Suction available and Patient being monitored Patient Re-evaluated:Patient Re-evaluated prior to induction Oxygen Delivery Method: Simple face mask Dental Injury: Teeth and Oropharynx as per pre-operative assessment        

## 2020-01-31 NOTE — Interval H&P Note (Signed)
History and Physical Interval Note:  01/31/2020 1:23 PM  Micheal [name]  has presented today for surgery, with the diagnosis of dysphagia.  The various methods of treatment have been discussed with the patient and family. After consideration of risks, benefits and other options for treatment, the patient has consented to  Procedure(s): ESOPHAGOGASTRODUODENOSCOPY (EGD) WITH PROPOFOL (N/A) as a surgical intervention.  The patient's history has been reviewed, patient examined, no change in status, stable for surgery.  I have reviewed the patient's chart and labs.  Questions were answered to the patient's satisfaction.     Tatum, [name]

## 2020-02-03 ENCOUNTER — Encounter: Payer: Self-pay | Admitting: *Deleted

## 2020-02-03 NOTE — Anesthesia Postprocedure Evaluation (Signed)
Anesthesia Post Note  Patient: Micheal [name]  Procedure(s) Performed: ESOPHAGOGASTRODUODENOSCOPY (EGD) WITH PROPOFOL (N/A )  Patient location during evaluation: Endoscopy Anesthesia Type: General Level of consciousness: awake and alert Pain management: pain level controlled Vital Signs Assessment: post-procedure vital signs reviewed and stable Respiratory status: spontaneous breathing, nonlabored ventilation, respiratory function stable and patient connected to nasal cannula oxygen Cardiovascular status: blood pressure returned to baseline and stable Postop Assessment: no apparent nausea or vomiting Anesthetic complications: no     Last Vitals:  Vitals:   01/31/20 1458 01/31/20 1508  BP: 111/69 (!) 143/98  Pulse: 64 (!) 49  Resp: 20 16  Temp:    SpO2: 98% 97%    Last Pain:  Vitals:   01/31/20 1508  TempSrc:   PainSc: 0-No pain                 [name]

## 2020-02-04 LAB — SURGICAL PATHOLOGY

## 2020-02-18 MED FILL — CARVEDILOL 6.25 MG TABLET: 6.25 | 30 days supply | Qty: 60 | Fill #3

## 2020-03-30 MED FILL — CONCERTA 18 MG TABLET ER: 18 | 30 days supply | Qty: 60 | Fill #0

## 2020-04-01 MED FILL — LEVOTHYROXINE SODIUM 75 MCG: 75 | 90 days supply | Qty: 90 | Fill #2

## 2020-04-01 MED FILL — CARVEDILOL 6.25 MG TABLET: 6.25 | 30 days supply | Qty: 60 | Fill #4

## 2020-06-11 ENCOUNTER — Other Ambulatory Visit (HOSPITAL_BASED_OUTPATIENT_CLINIC_OR_DEPARTMENT_OTHER): Payer: Self-pay | Admitting: Internal Medicine

## 2020-06-11 MED FILL — CONCERTA 18 MG TABLET ER: 18 | 30 days supply | Qty: 60 | Fill #0

## 2020-06-30 ENCOUNTER — Other Ambulatory Visit: Payer: Self-pay | Admitting: Otolaryngology

## 2020-07-09 ENCOUNTER — Other Ambulatory Visit: Payer: Self-pay

## 2020-07-15 MED FILL — AMOXICILLIN 500 MG CAPSULE: 500 | 10 days supply | Qty: 30 | Fill #0

## 2020-08-11 ENCOUNTER — Other Ambulatory Visit (HOSPITAL_BASED_OUTPATIENT_CLINIC_OR_DEPARTMENT_OTHER): Payer: Self-pay | Admitting: Internal Medicine

## 2020-08-11 MED FILL — CONCERTA 18 MG TABLET ER: 18 | 30 days supply | Qty: 60 | Fill #0

## 2020-08-11 MED FILL — LEVOTHYROXINE SODIUM 75 MCG: 75 | 90 days supply | Qty: 90 | Fill #3

## 2020-08-19 ENCOUNTER — Other Ambulatory Visit (HOSPITAL_COMMUNITY): Payer: Self-pay | Admitting: Internal Medicine

## 2020-08-19 ENCOUNTER — Ambulatory Visit: Payer: No Typology Code available for payment source | Attending: Internal Medicine

## 2020-08-19 DIAGNOSIS — Z23 Encounter for immunization: Secondary | ICD-10-CM

## 2020-08-19 NOTE — Progress Notes (Signed)
   Covid-19 Vaccination Clinic  Name:  Micheal [name]    MRN: [id number] DOB: April 14, 1976  08/19/2020  [name] was observed post Covid-19 immunization for 15 minutes without incident. He was provided with Vaccine Information Sheet and instruction to access the V-Safe system.   [name] was instructed to call 911 with any severe reactions post vaccine: Marland Kitchen Difficulty breathing  . Swelling of face and throat  . A fast heartbeat  . A bad rash all over body  . Dizziness and weakness   Immunizations Administered    Name Date Dose VIS Date Route   Pfizer COVID-19 Vaccine 08/19/2020  9:13 AM 0.3 mL 06/17/2020 Intramuscular   Manufacturer: [employer], [employer]   Lot: [id number]   NDC: [address]-9

## 2020-09-08 ENCOUNTER — Other Ambulatory Visit (HOSPITAL_BASED_OUTPATIENT_CLINIC_OR_DEPARTMENT_OTHER): Payer: Self-pay

## 2020-09-08 MED FILL — AMOXICILLIN 500 MG CAPSULE: 500 | 10 days supply | Qty: 30 | Fill #0

## 2020-09-22 MED FILL — AMOXICILLIN 500 MG CAPSULE: 500 | 10 days supply | Qty: 30 | Fill #0

## 2020-10-28 ENCOUNTER — Other Ambulatory Visit (HOSPITAL_BASED_OUTPATIENT_CLINIC_OR_DEPARTMENT_OTHER): Payer: Self-pay | Admitting: Internal Medicine

## 2020-10-28 MED FILL — CONCERTA 18 MG TABLET ER: 18 | 30 days supply | Qty: 60 | Fill #0

## 2020-11-19 ENCOUNTER — Other Ambulatory Visit (HOSPITAL_COMMUNITY): Payer: Self-pay | Admitting: Pharmacist

## 2020-11-19 MED FILL — CARESTART COVID-19 HOME TES: 4 days supply | Qty: 4 | Fill #0

## 2020-12-02 ENCOUNTER — Other Ambulatory Visit: Payer: Self-pay

## 2020-12-02 MED ORDER — ONDANSETRON 4 MG PO TBDP
ORAL_TABLET | ORAL | 0 refills | Status: AC
  Filled 2020-12-02: qty 20, 6d supply, fill #0

## 2021-02-10 ENCOUNTER — Other Ambulatory Visit (HOSPITAL_BASED_OUTPATIENT_CLINIC_OR_DEPARTMENT_OTHER): Payer: Self-pay

## 2021-02-10 ENCOUNTER — Other Ambulatory Visit: Payer: Self-pay

## 2021-02-10 MED ORDER — LOSARTAN POTASSIUM 100 MG PO TABS
ORAL_TABLET | ORAL | 11 refills | Status: AC
  Filled 2021-02-10: qty 90, 90d supply, fill #0

## 2021-03-05 ENCOUNTER — Other Ambulatory Visit (HOSPITAL_BASED_OUTPATIENT_CLINIC_OR_DEPARTMENT_OTHER): Payer: Self-pay

## 2021-03-05 MED ORDER — METHYLPHENIDATE HCL ER (OSM) 18 MG PO TBCR
EXTENDED_RELEASE_TABLET | ORAL | 0 refills | Status: AC
  Filled 2021-03-05 – 2021-03-23 (×2): qty 60, 30d supply, fill #0

## 2021-03-05 MED ORDER — LEVOTHYROXINE SODIUM 75 MCG PO TABS
ORAL_TABLET | ORAL | 3 refills | Status: DC
  Filled 2021-03-05 – 2021-03-23 (×2): qty 90, 90d supply, fill #0
  Filled 2021-09-03: qty 90, 90d supply, fill #1

## 2021-03-08 ENCOUNTER — Other Ambulatory Visit (HOSPITAL_BASED_OUTPATIENT_CLINIC_OR_DEPARTMENT_OTHER): Payer: Self-pay

## 2021-03-15 ENCOUNTER — Other Ambulatory Visit (HOSPITAL_BASED_OUTPATIENT_CLINIC_OR_DEPARTMENT_OTHER): Payer: Self-pay

## 2021-03-23 ENCOUNTER — Other Ambulatory Visit (HOSPITAL_BASED_OUTPATIENT_CLINIC_OR_DEPARTMENT_OTHER): Payer: Self-pay

## 2021-05-13 ENCOUNTER — Other Ambulatory Visit (HOSPITAL_BASED_OUTPATIENT_CLINIC_OR_DEPARTMENT_OTHER): Payer: Self-pay

## 2021-05-13 MED ORDER — METHYLPHENIDATE HCL ER (OSM) 18 MG PO TBCR
18.0000 mg | EXTENDED_RELEASE_TABLET | Freq: Every day | ORAL | 0 refills | Status: AC
  Filled 2021-05-13: qty 60, 30d supply, fill #0

## 2021-05-14 ENCOUNTER — Other Ambulatory Visit (HOSPITAL_BASED_OUTPATIENT_CLINIC_OR_DEPARTMENT_OTHER): Payer: Self-pay

## 2021-05-17 ENCOUNTER — Other Ambulatory Visit (HOSPITAL_BASED_OUTPATIENT_CLINIC_OR_DEPARTMENT_OTHER): Payer: Self-pay

## 2021-05-18 ENCOUNTER — Other Ambulatory Visit (HOSPITAL_BASED_OUTPATIENT_CLINIC_OR_DEPARTMENT_OTHER): Payer: Self-pay

## 2021-05-19 ENCOUNTER — Other Ambulatory Visit (HOSPITAL_BASED_OUTPATIENT_CLINIC_OR_DEPARTMENT_OTHER): Payer: Self-pay

## 2021-05-20 ENCOUNTER — Other Ambulatory Visit (HOSPITAL_BASED_OUTPATIENT_CLINIC_OR_DEPARTMENT_OTHER): Payer: Self-pay

## 2021-05-21 ENCOUNTER — Other Ambulatory Visit (HOSPITAL_BASED_OUTPATIENT_CLINIC_OR_DEPARTMENT_OTHER): Payer: Self-pay

## 2021-05-24 ENCOUNTER — Other Ambulatory Visit (HOSPITAL_BASED_OUTPATIENT_CLINIC_OR_DEPARTMENT_OTHER): Payer: Self-pay

## 2021-06-03 ENCOUNTER — Other Ambulatory Visit (HOSPITAL_BASED_OUTPATIENT_CLINIC_OR_DEPARTMENT_OTHER): Payer: Self-pay

## 2021-06-03 MED ORDER — METHYLPHENIDATE HCL ER (OSM) 18 MG PO TBCR
EXTENDED_RELEASE_TABLET | ORAL | 0 refills | Status: AC
  Filled 2021-06-03 – 2021-06-15 (×2): qty 60, 30d supply, fill #0

## 2021-06-04 ENCOUNTER — Other Ambulatory Visit (HOSPITAL_BASED_OUTPATIENT_CLINIC_OR_DEPARTMENT_OTHER): Payer: Self-pay

## 2021-06-11 ENCOUNTER — Other Ambulatory Visit (HOSPITAL_BASED_OUTPATIENT_CLINIC_OR_DEPARTMENT_OTHER): Payer: Self-pay

## 2021-06-15 ENCOUNTER — Other Ambulatory Visit (HOSPITAL_BASED_OUTPATIENT_CLINIC_OR_DEPARTMENT_OTHER): Payer: Self-pay

## 2021-08-16 ENCOUNTER — Other Ambulatory Visit: Payer: Self-pay

## 2021-08-16 MED ORDER — LOSARTAN POTASSIUM-HCTZ 100-12.5 MG PO TABS
1.0000 | ORAL_TABLET | Freq: Every day | ORAL | 11 refills | Status: AC
  Filled 2021-08-16: qty 90, 90d supply, fill #0

## 2021-09-03 ENCOUNTER — Other Ambulatory Visit (HOSPITAL_BASED_OUTPATIENT_CLINIC_OR_DEPARTMENT_OTHER): Payer: Self-pay

## 2021-09-06 ENCOUNTER — Other Ambulatory Visit (HOSPITAL_BASED_OUTPATIENT_CLINIC_OR_DEPARTMENT_OTHER): Payer: Self-pay

## 2021-09-06 MED ORDER — METHYLPHENIDATE HCL ER (OSM) 18 MG PO TBCR
EXTENDED_RELEASE_TABLET | ORAL | 0 refills | Status: AC
  Filled 2021-09-06: qty 60, 30d supply, fill #0

## 2021-10-13 ENCOUNTER — Other Ambulatory Visit (HOSPITAL_BASED_OUTPATIENT_CLINIC_OR_DEPARTMENT_OTHER): Payer: Self-pay

## 2021-10-13 MED ORDER — METHYLPHENIDATE HCL ER (OSM) 18 MG PO TBCR
18.0000 mg | EXTENDED_RELEASE_TABLET | Freq: Every day | ORAL | 0 refills | Status: DC
  Filled 2021-10-13 – 2021-10-26 (×2): qty 60, 30d supply, fill #0

## 2021-10-22 ENCOUNTER — Other Ambulatory Visit (HOSPITAL_BASED_OUTPATIENT_CLINIC_OR_DEPARTMENT_OTHER): Payer: Self-pay

## 2021-10-26 ENCOUNTER — Other Ambulatory Visit (HOSPITAL_BASED_OUTPATIENT_CLINIC_OR_DEPARTMENT_OTHER): Payer: Self-pay

## 2021-10-26 MED ORDER — LOSARTAN POTASSIUM 100 MG PO TABS
ORAL_TABLET | ORAL | 3 refills | Status: AC
  Filled 2021-10-26: qty 90, 90d supply, fill #0

## 2021-11-12 ENCOUNTER — Other Ambulatory Visit: Payer: Self-pay

## 2021-11-12 MED ORDER — CARESTART COVID-19 HOME TEST VI KIT
PACK | 0 refills | Status: AC
  Filled 2021-11-12: qty 2, 4d supply, fill #0

## 2021-11-16 ENCOUNTER — Other Ambulatory Visit
Admission: RE | Admit: 2021-11-16 | Discharge: 2021-11-16 | Disposition: A | Discharge status: Home / Self Care | Payer: No Typology Code available for payment source | Source: Ambulatory Visit | Attending: Internal Medicine | Admitting: Internal Medicine

## 2021-11-16 DIAGNOSIS — R0602 Shortness of breath: Secondary | ICD-10-CM | POA: Insufficient documentation

## 2021-11-16 LAB — D-DIMER, QUANTITATIVE: D-Dimer, Quant: 0.32 ug/mL-FEU (ref 0.00–0.50)

## 2021-11-22 ENCOUNTER — Other Ambulatory Visit (HOSPITAL_BASED_OUTPATIENT_CLINIC_OR_DEPARTMENT_OTHER): Payer: Self-pay

## 2021-11-22 MED ORDER — METHYLPHENIDATE HCL ER (OSM) 18 MG PO TBCR
EXTENDED_RELEASE_TABLET | ORAL | 0 refills | Status: DC
  Filled 2021-11-22: qty 60, 30d supply, fill #0

## 2021-12-22 ENCOUNTER — Emergency Department (HOSPITAL_BASED_OUTPATIENT_CLINIC_OR_DEPARTMENT_OTHER): Payer: No Typology Code available for payment source

## 2021-12-22 ENCOUNTER — Encounter (HOSPITAL_BASED_OUTPATIENT_CLINIC_OR_DEPARTMENT_OTHER): Payer: Self-pay | Admitting: Emergency Medicine

## 2021-12-22 ENCOUNTER — Emergency Department (HOSPITAL_BASED_OUTPATIENT_CLINIC_OR_DEPARTMENT_OTHER)
Admission: EM | Admit: 2021-12-22 | Discharge: 2021-12-23 | Disposition: A | Discharge status: Home / Self Care | Payer: No Typology Code available for payment source | Attending: Emergency Medicine | Admitting: Emergency Medicine

## 2021-12-22 ENCOUNTER — Other Ambulatory Visit: Payer: Self-pay

## 2021-12-22 DIAGNOSIS — R509 Fever, unspecified: Secondary | ICD-10-CM

## 2021-12-22 DIAGNOSIS — R0602 Shortness of breath: Secondary | ICD-10-CM

## 2021-12-22 DIAGNOSIS — R000 Tachycardia, unspecified: Secondary | ICD-10-CM | POA: Insufficient documentation

## 2021-12-22 DIAGNOSIS — Z8616 Personal history of COVID-19: Secondary | ICD-10-CM | POA: Insufficient documentation

## 2021-12-22 DIAGNOSIS — E039 Hypothyroidism, unspecified: Secondary | ICD-10-CM | POA: Diagnosis not present

## 2021-12-22 DIAGNOSIS — Z79899 Other long term (current) drug therapy: Secondary | ICD-10-CM | POA: Insufficient documentation

## 2021-12-22 DIAGNOSIS — R072 Precordial pain: Secondary | ICD-10-CM | POA: Diagnosis not present

## 2021-12-22 DIAGNOSIS — Z20822 Contact with and (suspected) exposure to covid-19: Secondary | ICD-10-CM | POA: Insufficient documentation

## 2021-12-22 DIAGNOSIS — R079 Chest pain, unspecified: Secondary | ICD-10-CM | POA: Diagnosis present

## 2021-12-22 DIAGNOSIS — R110 Nausea: Secondary | ICD-10-CM | POA: Insufficient documentation

## 2021-12-22 DIAGNOSIS — R6889 Other general symptoms and signs: Secondary | ICD-10-CM

## 2021-12-22 LAB — COMPREHENSIVE METABOLIC PANEL
ALT: 56 U/L — ABNORMAL HIGH (ref 0–44)
AST: 35 U/L (ref 15–41)
Albumin: 4.4 g/dL (ref 3.5–5.0)
Alkaline Phosphatase: 56 U/L (ref 38–126)
Anion gap: 6 (ref 5–15)
BUN: 14 mg/dL (ref 6–20)
CO2: 27 mmol/L (ref 22–32)
Calcium: 8.9 mg/dL (ref 8.9–10.3)
Chloride: 104 mmol/L (ref 98–111)
Creatinine, Ser: 1.11 mg/dL (ref 0.61–1.24)
GFR, Estimated: >60 mL/min (ref >60)
Glucose, Bld: 95 mg/dL (ref 70–99)
Potassium: 3.9 mmol/L (ref 3.5–5.1)
Sodium: 137 mmol/L (ref 135–145)
Total Bilirubin: 1.4 mg/dL — ABNORMAL HIGH (ref 0.3–1.2)
Total Protein: 7.1 g/dL (ref 6.5–8.1)

## 2021-12-22 LAB — CBC WITH DIFFERENTIAL/PLATELET
Abs Immature Granulocytes: 0.03 10*3/uL (ref 0.00–0.07)
Basophils Absolute: 0 10*3/uL (ref 0.0–0.1)
Basophils Relative: 0 %
Eosinophils Absolute: 0.1 10*3/uL (ref 0.0–0.5)
Eosinophils Relative: 1 %
HCT: 45.8 % (ref 39.0–52.0)
Hemoglobin: 16.6 g/dL (ref 13.0–17.0)
Immature Granulocytes: 0 %
Lymphocytes Relative: 8 %
Lymphs Abs: 0.6 10*3/uL — ABNORMAL LOW (ref 0.7–4.0)
MCH: 32 pg (ref 26.0–34.0)
MCHC: 36.2 g/dL — ABNORMAL HIGH (ref 30.0–36.0)
MCV: 88.4 fL (ref 80.0–100.0)
Monocytes Absolute: 0.4 10*3/uL (ref 0.1–1.0)
Monocytes Relative: 5 %
Neutro Abs: 6.8 10*3/uL (ref 1.7–7.7)
Neutrophils Relative %: 86 %
Platelets: 135 10*3/uL — ABNORMAL LOW (ref 150–400)
RBC: 5.18 MIL/uL (ref 4.22–5.81)
RDW: 13 % (ref 11.5–15.5)
WBC: 8 10*3/uL (ref 4.0–10.5)
nRBC: 0 % (ref 0.0–0.2)

## 2021-12-22 LAB — TROPONIN I (HIGH SENSITIVITY)
Troponin I (High Sensitivity): 4 ng/L (ref <18)
Troponin I (High Sensitivity): 4 ng/L (ref <18)

## 2021-12-22 LAB — D-DIMER, QUANTITATIVE: D-Dimer, Quant: <0.27 ug/mL-FEU (ref 0.00–0.50)

## 2021-12-22 MED ORDER — ASPIRIN 81 MG PO CHEW
324.0000 mg | CHEWABLE_TABLET | Freq: Once | ORAL | Status: AC
  Administered 2021-12-22: 324 mg via ORAL
  Filled 2021-12-22: qty 4

## 2021-12-22 MED ORDER — SODIUM CHLORIDE 0.9 % IV BOLUS
500.0000 mL | Freq: Once | INTRAVENOUS | Status: AC
  Administered 2021-12-23: 500 mL via INTRAVENOUS

## 2021-12-22 MED ORDER — ACETAMINOPHEN 500 MG PO TABS
1000.0000 mg | ORAL_TABLET | Freq: Once | ORAL | Status: AC
  Administered 2021-12-22: 1000 mg via ORAL
  Filled 2021-12-22: qty 2

## 2021-12-22 NOTE — ED Provider Notes (Addendum)
?[facility] EMERGENCY DEPARTMENT ?Provider Note ? ? ?CSN: [id number] ?Arrival date & time: 12/22/21  2043 ? ?  ? ?History ? ?Chief Complaint  ?Patient presents with  ? Tachycardia  ? ? ?Micheal [name] is a 46 y.o. male. ? ?Patient with the onset of chest pain with a deep breath and some pain kind of between the shoulder blades down low starting about 30 minutes prior to checking in.  Patient also feels flushed.  Feels as if his heart rate is elevated.  Blood pressure was running a little high had some nausea.  Patient had COVID about 5 weeks ago has had a negative D-dimer since that time.  Because he was evaluated for shortness of breath.  Still feels a little shortness of breath following that.  Past medical history significant for Gilbert's syndrome hypothyroidism sleep apnea attention deficit disorder.  No known coronary artery disease.  Patient never smoked tobacco.  Patient is not on an aspirin a day. ? 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  An EKG was done which showed some subtle ST segment elevation in 2 and aVF.  EKG officially read as inferior infarct old.  Not consistent with an acute STEMI. ? ? ?  ? ?Home Medications ?Prior to Admission medications   ?Medication Sig Start Date End Date Taking? Authorizing Provider  ?acetaminophen (TYLENOL) 500 MG tablet Take 500 mg by mouth every 6 (six) hours as needed.    [provider]  ?carvedilol (COREG) 6.25 MG tablet Take 6.25 mg by mouth 2 (two) times daily with a meal.    [provider]  ?COVID-19 At Home Antigen Test [facility] COVID-19 HOME TEST) KIT Use as directed 11/12/21   Letta Median, RPH  ?fluticasone (FLONASE) 50 MCG/ACT nasal spray PLACE 2 SPRAYS EACH NOSTRIL ONCE DAILY 06/30/20 06/30/21  Clyde Canterbury, MD  ?levothyroxine (SYNTHROID) 50 MCG tablet TAKE 1 TABLET BY MOUTH ONCE DAILY BEFORE BREAKFAST 06/13/19   Venia Carbon, MD  ?levothyroxine (SYNTHROID) 75 MCG tablet Take 1 tablet (75 mcg  total) by mouth once daily on an empty stomach with a glass of water at least 30-60 minutes before breakfast. 03/04/21     ?losartan (COZAAR) 100 MG tablet Take 1 tablet (100 mg total) by mouth once daily 02/10/21     ?losartan (COZAAR) 100 MG tablet Take 1 tablet (100 mg total) by mouth once daily 02/10/21     ?losartan (COZAAR) 100 MG tablet Take 1 tablet (100 mg total) by mouth once daily 10/26/21     ?losartan-hydrochlorothiazide (HYZAAR) 100-12.5 MG tablet Take 1 tablet by mouth once daily 08/16/21     ?methylphenidate (CONCERTA) 18 MG PO CR tablet Take 1 to 2 tablets by mouth once daily 09/06/21     ?methylphenidate 18 MG PO CR tablet Take 1-2 tablets (18-36 mg total) by mouth daily. 06/24/19   Venia Carbon, MD  ?methylphenidate 18 MG PO CR tablet TAKE 1 - 2 TABLETS BY MOUTH ONCE DAILY 10/28/20 04/26/21  Kirk Ruths, MD  ?methylphenidate 18 MG PO CR tablet TAKE 1 - 2 TABLETS BY MOUTH ONCE DAILY 08/11/20 02/07/21  Kirk Ruths, MD  ?methylphenidate 18 MG PO CR tablet TAKE 1-2 TABLETS (18-36 MG TOTAL) BY MOUTH ONCE DAILY 06/11/20 12/08/20  Kirk Ruths, MD  ?methylphenidate 18 MG PO CR tablet Take 1 to 2 tablets by mouth daily 03/05/21     ?methylphenidate (CONCERTA) 18 MG PO CR tablet Take  1-2 tablets (18-36 mg total) by mouth daily. 05/13/21     ?methylphenidate (CONCERTA) 18 MG PO CR tablet Take 1 to 2 tablets by mouth once daily 06/03/21     ?methylphenidate 18 MG PO CR tablet Take 1-2 tablets (18-36 mg total) by mouth once daily 11/22/21     ?Multiple Vitamin (MULTIVITAMIN) tablet Take 1 tablet by mouth daily.    [provider]  ?naproxen sodium (ALEVE) 220 MG tablet Take 220 mg by mouth.    [provider]  ?omeprazole (PRILOSEC) 20 MG capsule Take 1 capsule (20 mg total) by mouth daily. 09/26/18   Jearld Fenton, NP  ?omeprazole (PRILOSEC) 40 MG capsule TAKE 1 CAPSULE BY MOUTH ONCE DAILY, 30 MINUTES PRIOR EVENING MEAL 06/30/20 06/30/21  Clyde Canterbury, MD  ?ondansetron  (ZOFRAN-ODT) 4 MG disintegrating tablet Take 1 tablet (4 mg total) by mouth every 8 (eight) hours as needed for Nausea 12/02/20     ?   ? ?Allergies    ?Calcium channel blockers and Hctz [hydrochlorothiazide]   ? ?Review of Systems   ?Review of Systems  ?Constitutional:  Negative for chills and fever.  ?HENT:  Negative for ear pain and sore throat.   ?Eyes:  Negative for pain and visual disturbance.  ?Respiratory:  Positive for shortness of breath. Negative for cough.   ?Cardiovascular:  Positive for chest pain. Negative for palpitations and leg swelling.  ?Gastrointestinal:  Negative for abdominal pain and vomiting.  ?Genitourinary:  Negative for dysuria and hematuria.  ?Musculoskeletal:  Positive for back pain. Negative for arthralgias.  ?Skin:  Negative for color change and rash.  ?Neurological:  Negative for seizures and syncope.  ?Hematological:  Does not bruise/bleed easily.  ?All other systems reviewed and are negative. ? ?Physical Exam ?Updated Vital Signs ?BP (!) 138/91 (BP Location: Right Arm)   Pulse (!) 113   Temp 98.8 ?F (37.1 ?C) (Oral)   Resp 19   Ht 1.803 m ($Remove'5\' 11"'BVPqBxp$ )   Wt 117.9 kg   SpO2 100%   BMI 36.26 kg/m?  ?Physical Exam ?Vitals and nursing note reviewed.  ?Constitutional:   ?   General: He is not in acute distress. ?   Appearance: Normal appearance. He is well-developed. He is not ill-appearing.  ?HENT:  ?   Head: Normocephalic and atraumatic.  ?Eyes:  ?   Extraocular Movements: Extraocular movements intact.  ?   Conjunctiva/sclera: Conjunctivae normal.  ?   Pupils: Pupils are equal, round, and reactive to light.  ?Cardiovascular:  ?   Rate and Rhythm: Regular rhythm. Tachycardia present.  ?   Heart sounds: No murmur heard. ?Pulmonary:  ?   Effort: Pulmonary effort is normal. No respiratory distress.  ?   Breath sounds: Normal breath sounds.  ?Abdominal:  ?   Palpations: Abdomen is soft.  ?   Tenderness: There is no abdominal tenderness.  ?Musculoskeletal:     ?   General: No swelling.  ?    Cervical back: Normal range of motion and neck supple.  ?   Right lower leg: No edema.  ?   Left lower leg: No edema.  ?Skin: ?   General: Skin is warm and dry.  ?   Capillary Refill: Capillary refill takes less than 2 seconds.  ?Neurological:  ?   General: No focal deficit present.  ?   Mental Status: He is alert and oriented to person, place, and time.  ?   Cranial Nerves: No cranial nerve deficit.  ?  Sensory: No sensory deficit.  ?   Motor: No weakness.  ?Psychiatric:     ?   Mood and Affect: Mood normal.  ? ? ?ED Results / Procedures / Treatments   ?Labs ?(all labs ordered are listed, but only abnormal results are displayed) ?Labs Reviewed  ?CBC WITH DIFFERENTIAL/PLATELET - Abnormal; Notable for the following components:  ?    Result Value  ? MCHC 36.2 (*)   ? Platelets 135 (*)   ? Lymphs Abs 0.6 (*)   ? All other components within normal limits  ?COMPREHENSIVE METABOLIC PANEL - Abnormal; Notable for the following components:  ? ALT 56 (*)   ? Total Bilirubin 1.4 (*)   ? All other components within normal limits  ?D-DIMER, QUANTITATIVE  ?TROPONIN I (HIGH SENSITIVITY)  ? ? ?EKG ?EKG Interpretation ? ?Date/Time:  Wednesday December 22 2021 20:47:37 EDT ?Ventricular Rate:  118 ?PR Interval:  146 ?QRS Duration: 90 ?QT Interval:  301 ?QTC Calculation: 422 ?R Axis:   105 ?Text Interpretation: Sinus tachycardia Inferior infarct, old No previous ECGs available Confirmed by Fredia Sorrow [phone]) on 12/22/2021 9:02:39 PM ? ?Radiology ?DG Chest Portable 1 View ? ?Result Date: 12/22/2021 ?CLINICAL DATA:  Tachycardia EXAM: PORTABLE CHEST 1 VIEW COMPARISON:  None. FINDINGS: The heart size and mediastinal contours are within normal limits. Both lungs are clear. The visualized skeletal structures are unremarkable. IMPRESSION: No active disease. Electronically Signed   By: Rolm Baptise M.D.   On: 12/22/2021 21:04   ? ?Procedures ?Procedures  ? ? ?Medications Ordered in ED ?Medications  ?aspirin chewable tablet 324 mg (has no  administration in time range)  ? ? ?ED Course/ Medical Decision Making/ A&P ?  ?                        ?Medical Decision Making ?Amount and/or Complexity of Data Reviewed ?Labs: ordered. ?Radiology: ordered. ? ?Risk

## 2021-12-22 NOTE — Discharge Instructions (Addendum)
Work note provided.  Symptomatic treatment for flulike illness.  Tylenol for the fever over-the-counter cold and flu medicines as needed.  Make an appointment to follow-up with your doctor for blood pressure rechecks.  Work-up for the concerns for the chest pain and the shortness of breath without any acute findings at this time chest x-ray negative troponin is negative. ?

## 2021-12-22 NOTE — ED Triage Notes (Signed)
Pt c/o elevated heart rate, hypertension, nausea and back pain.  ?

## 2021-12-23 ENCOUNTER — Other Ambulatory Visit: Payer: Self-pay

## 2021-12-23 ENCOUNTER — Emergency Department (HOSPITAL_BASED_OUTPATIENT_CLINIC_OR_DEPARTMENT_OTHER): Payer: No Typology Code available for payment source

## 2021-12-23 LAB — RESP PANEL BY RT-PCR (FLU A&B, COVID) ARPGX2
Influenza A by PCR: NEGATIVE
Influenza B by PCR: NEGATIVE
SARS Coronavirus 2 by RT PCR: NEGATIVE

## 2021-12-23 MED ORDER — DOXYCYCLINE HYCLATE 100 MG PO TABS
ORAL_TABLET | ORAL | 0 refills | Status: AC
  Filled 2021-12-23: qty 14, 7d supply, fill #0

## 2021-12-23 MED ORDER — IOHEXOL 350 MG/ML SOLN
75.0000 mL | Freq: Once | INTRAVENOUS | Status: AC | PRN
  Administered 2021-12-23: 75 mL via INTRAVENOUS

## 2021-12-23 MED ORDER — KETOROLAC TROMETHAMINE 15 MG/ML IJ SOLN
15.0000 mg | Freq: Once | INTRAMUSCULAR | Status: AC
  Administered 2021-12-23: 15 mg via INTRAVENOUS
  Filled 2021-12-23: qty 1

## 2021-12-23 NOTE — ED Notes (Signed)
Patient transported to CT 

## 2022-01-11 ENCOUNTER — Encounter: Payer: Self-pay | Admitting: Internal Medicine

## 2022-01-12 ENCOUNTER — Encounter: Payer: Self-pay | Admitting: Certified Registered Nurse Anesthetist

## 2022-01-12 ENCOUNTER — Encounter: Admission: RE | Payer: Self-pay | Source: Home / Self Care

## 2022-01-12 ENCOUNTER — Ambulatory Visit
Admission: RE | Admit: 2022-01-12 | Payer: No Typology Code available for payment source | Source: Home / Self Care | Admitting: Internal Medicine

## 2022-01-12 HISTORY — DX: Celiac disease: K90.0

## 2022-01-12 SURGERY — EGD (ESOPHAGOGASTRODUODENOSCOPY)
Anesthesia: General

## 2022-01-28 ENCOUNTER — Other Ambulatory Visit (HOSPITAL_BASED_OUTPATIENT_CLINIC_OR_DEPARTMENT_OTHER): Payer: Self-pay

## 2022-01-28 MED ORDER — METHYLPHENIDATE HCL ER (OSM) 18 MG PO TBCR
EXTENDED_RELEASE_TABLET | ORAL | 0 refills | Status: DC
  Filled 2022-01-28: qty 60, 30d supply, fill #0

## 2022-01-28 MED ORDER — LOSARTAN POTASSIUM 100 MG PO TABS
ORAL_TABLET | ORAL | 3 refills | Status: AC
  Filled 2022-01-28: qty 90, 90d supply, fill #0

## 2022-03-02 ENCOUNTER — Other Ambulatory Visit (HOSPITAL_BASED_OUTPATIENT_CLINIC_OR_DEPARTMENT_OTHER): Payer: Self-pay

## 2022-03-02 MED ORDER — METHYLPHENIDATE HCL ER (OSM) 18 MG PO TBCR
EXTENDED_RELEASE_TABLET | ORAL | 0 refills | Status: AC
  Filled 2022-03-02: qty 60, 30d supply, fill #0

## 2022-04-14 ENCOUNTER — Other Ambulatory Visit (HOSPITAL_BASED_OUTPATIENT_CLINIC_OR_DEPARTMENT_OTHER): Payer: Self-pay

## 2022-04-14 MED ORDER — METHYLPHENIDATE HCL ER (OSM) 18 MG PO TBCR
18.0000 mg | EXTENDED_RELEASE_TABLET | Freq: Every day | ORAL | 0 refills | Status: DC
  Filled 2022-04-14: qty 60, 30d supply, fill #0

## 2022-04-14 MED ORDER — LOSARTAN POTASSIUM 100 MG PO TABS
100.0000 mg | ORAL_TABLET | Freq: Every day | ORAL | 3 refills | Status: AC
  Filled 2022-04-14: qty 90, 90d supply, fill #0

## 2022-05-26 ENCOUNTER — Other Ambulatory Visit (HOSPITAL_BASED_OUTPATIENT_CLINIC_OR_DEPARTMENT_OTHER): Payer: Self-pay

## 2022-05-26 MED ORDER — LOSARTAN POTASSIUM 100 MG PO TABS
ORAL_TABLET | ORAL | 3 refills | Status: AC
  Filled 2022-05-26 – 2022-09-15 (×2): qty 90, 90d supply, fill #0
  Filled 2023-01-31: qty 90, 90d supply, fill #1

## 2022-05-26 MED ORDER — METHYLPHENIDATE HCL ER (OSM) 18 MG PO TBCR
EXTENDED_RELEASE_TABLET | ORAL | 0 refills | Status: DC
  Filled 2022-05-26: qty 60, 30d supply, fill #0

## 2022-05-27 ENCOUNTER — Other Ambulatory Visit (HOSPITAL_BASED_OUTPATIENT_CLINIC_OR_DEPARTMENT_OTHER): Payer: Self-pay

## 2022-05-30 ENCOUNTER — Other Ambulatory Visit (HOSPITAL_BASED_OUTPATIENT_CLINIC_OR_DEPARTMENT_OTHER): Payer: Self-pay

## 2022-05-30 MED ORDER — LEVOTHYROXINE SODIUM 75 MCG PO TABS
75.0000 ug | ORAL_TABLET | Freq: Every day | ORAL | 3 refills | Status: AC
  Filled 2022-05-30: qty 90, 90d supply, fill #0

## 2022-06-02 ENCOUNTER — Other Ambulatory Visit (HOSPITAL_BASED_OUTPATIENT_CLINIC_OR_DEPARTMENT_OTHER): Payer: Self-pay

## 2022-06-02 MED ORDER — FLUARIX QUADRIVALENT 0.5 ML IM SUSY
PREFILLED_SYRINGE | INTRAMUSCULAR | 0 refills | Status: AC
  Filled 2022-06-02: qty 0.5, 1d supply, fill #0

## 2022-08-01 ENCOUNTER — Other Ambulatory Visit: Payer: Self-pay

## 2022-08-01 ENCOUNTER — Other Ambulatory Visit (HOSPITAL_BASED_OUTPATIENT_CLINIC_OR_DEPARTMENT_OTHER): Payer: Self-pay

## 2022-08-01 MED ORDER — FLUTICASONE FUROATE-VILANTEROL 100-25 MCG/ACT IN AEPB
1.0000 | INHALATION_SPRAY | Freq: Every day | RESPIRATORY_TRACT | 0 refills | Status: AC
  Filled 2022-08-01: qty 60, 30d supply, fill #0

## 2022-08-01 MED ORDER — FLUTICASONE FUROATE-VILANTEROL 100-25 MCG/ACT IN AEPB
INHALATION_SPRAY | RESPIRATORY_TRACT | 0 refills | Status: DC
  Filled 2022-08-01: qty 60, 30d supply, fill #0

## 2022-08-02 ENCOUNTER — Other Ambulatory Visit (HOSPITAL_BASED_OUTPATIENT_CLINIC_OR_DEPARTMENT_OTHER): Payer: Self-pay

## 2022-08-02 MED ORDER — LEVOTHYROXINE SODIUM 100 MCG PO TABS
100.0000 ug | ORAL_TABLET | Freq: Every day | ORAL | 3 refills | Status: AC
  Filled 2022-08-02 – 2022-09-15 (×2): qty 90, 90d supply, fill #0
  Filled 2023-01-31: qty 90, 90d supply, fill #1

## 2022-08-10 ENCOUNTER — Other Ambulatory Visit (HOSPITAL_BASED_OUTPATIENT_CLINIC_OR_DEPARTMENT_OTHER): Payer: Self-pay

## 2022-09-15 ENCOUNTER — Other Ambulatory Visit (HOSPITAL_BASED_OUTPATIENT_CLINIC_OR_DEPARTMENT_OTHER): Payer: Self-pay

## 2022-09-15 ENCOUNTER — Other Ambulatory Visit: Payer: Self-pay

## 2022-09-15 IMAGING — CT CT ANGIO CHEST
2 of 8 series · 18 of 36 positions shown · IV contrast (Omnipaque)
Comparison: Chest radiograph dated 12/22/2021

CLINICAL DATA: Hypertension, nausea, back pain, history of sleep
apnea

EXAM:
CT ANGIOGRAPHY CHEST WITH CONTRAST
TECHNIQUE: Multidetector CT imaging of the chest was performed using the
standard protocol during bolus administration of intravenous
contrast. Multiplanar CT image reconstructions and MIPs were
obtained to evaluate the vascular anatomy.

[Series 6: pe thins · axial · 0.94mm/px · z∈[+1071,+1272]mm · 17 of 225 slices shown]
[im 12/225  lung]
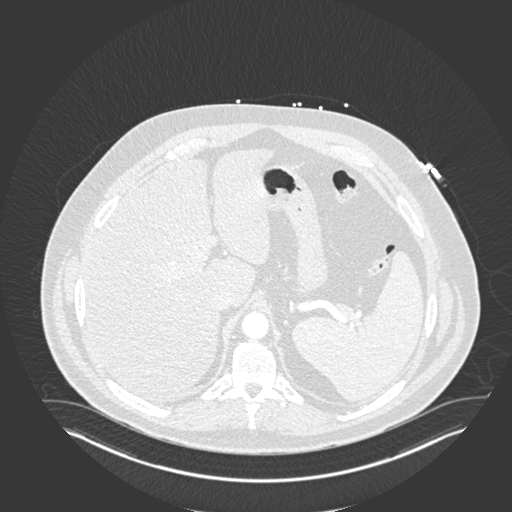
[im 24/225  mediastinal]
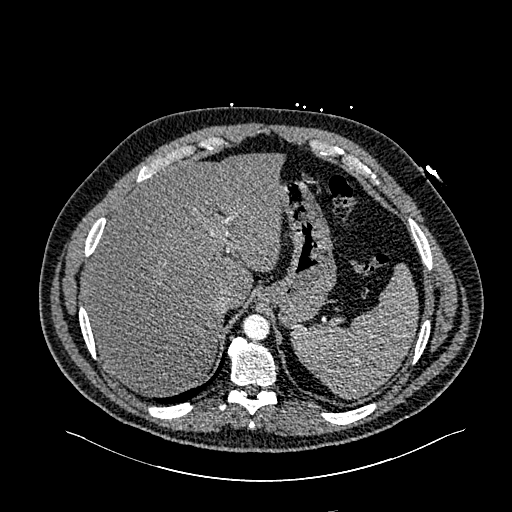
[im 36/225  lung]
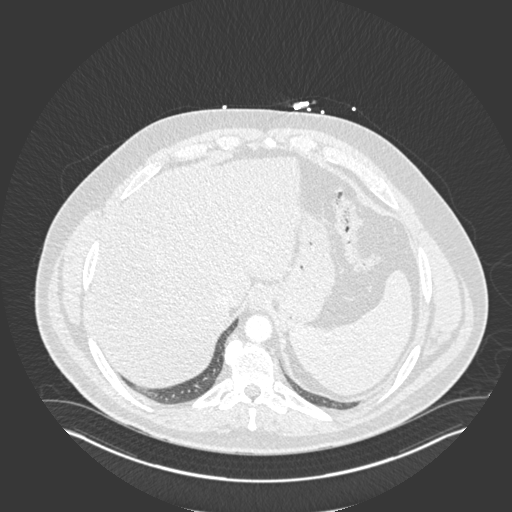
[im 48/225  mediastinal]
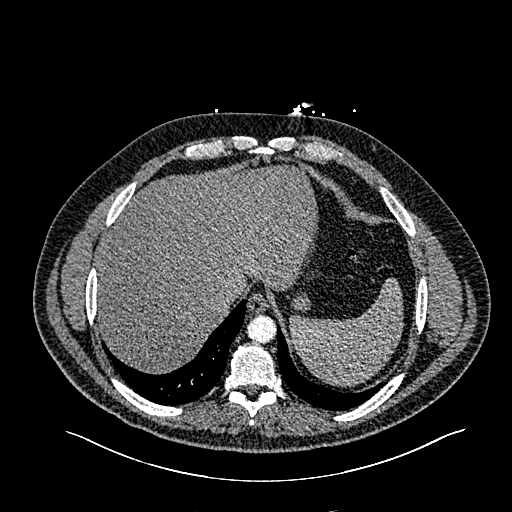
[im 59/225  lung]
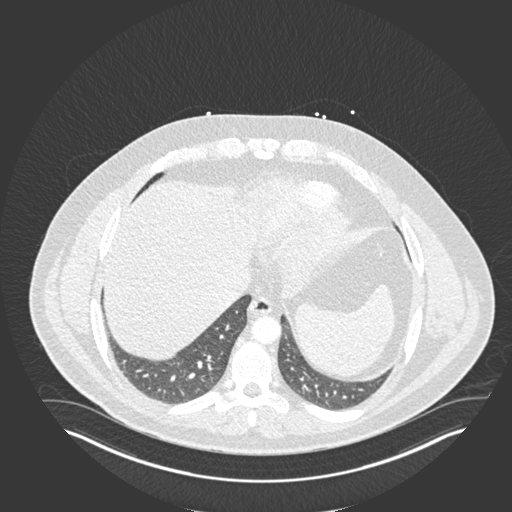
[im 71/225  mediastinal]
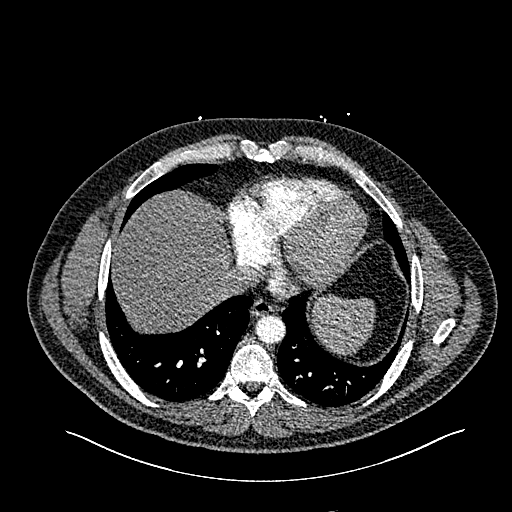
[im 83/225  lung]
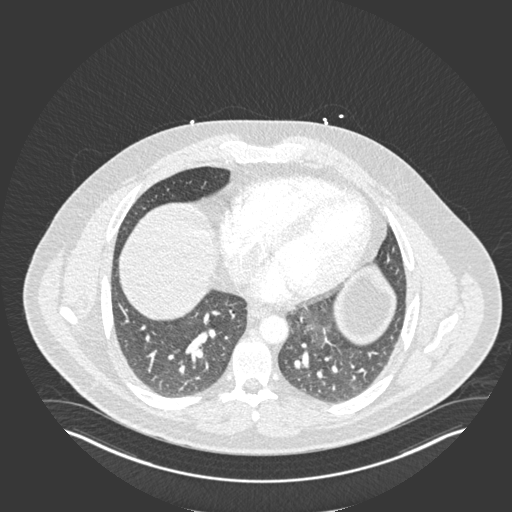
[im 95/225  mediastinal]
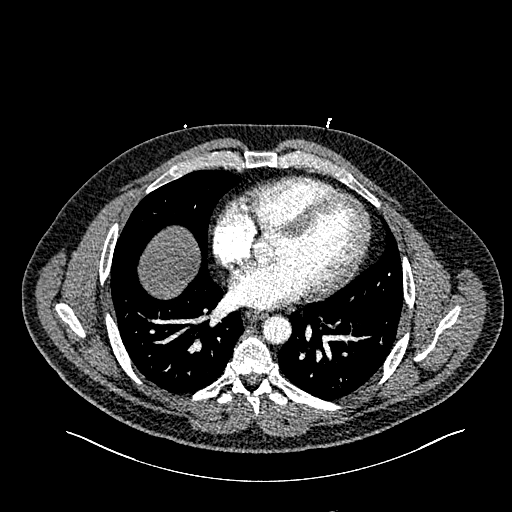
[im 118/225  lung]
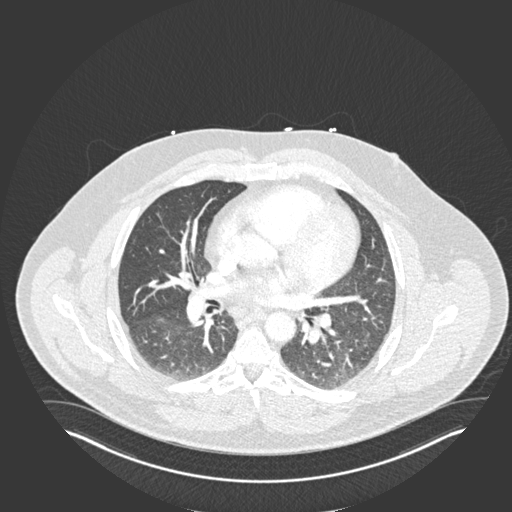
[im 130/225  mediastinal]
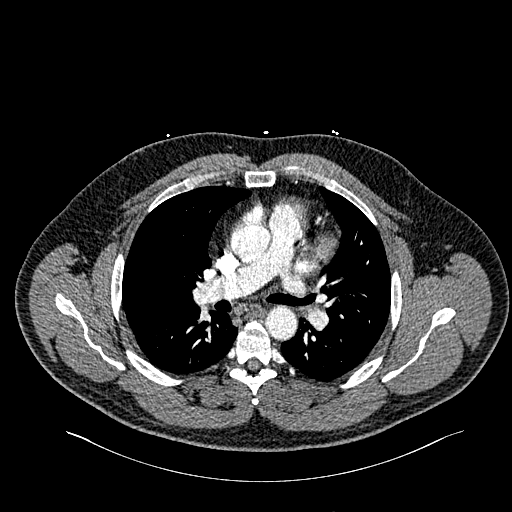
[im 142/225  lung]
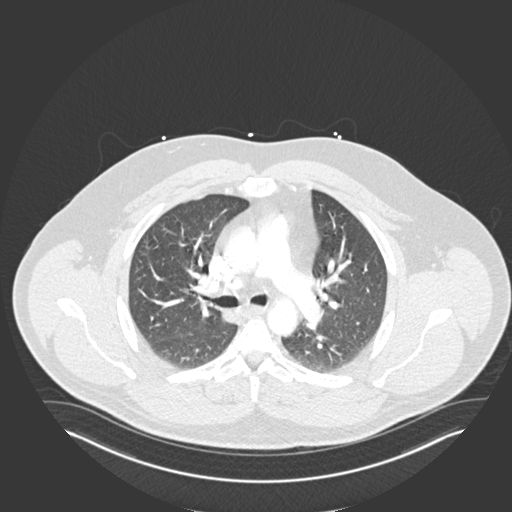
[im 154/225  mediastinal]
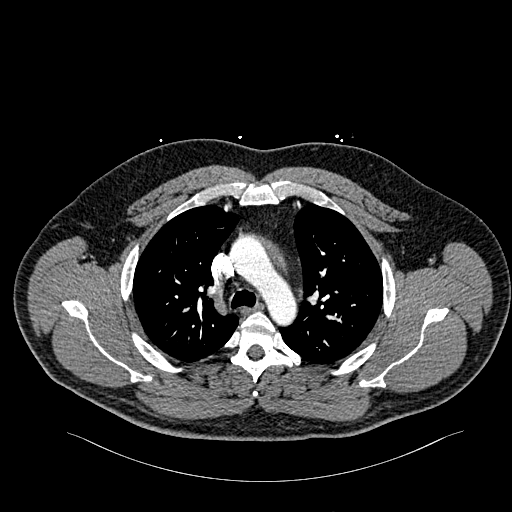
[im 166/225  lung]
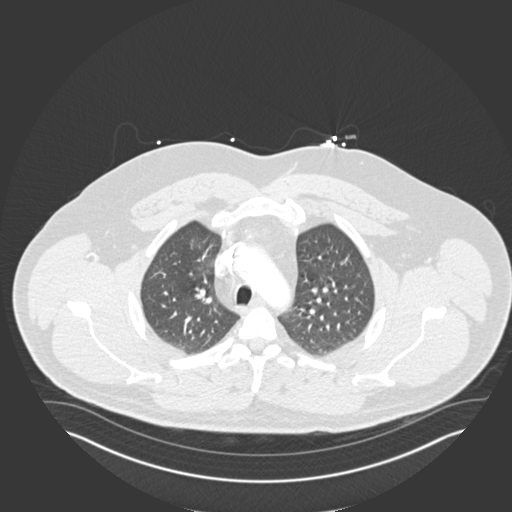
[im 177/225  mediastinal]
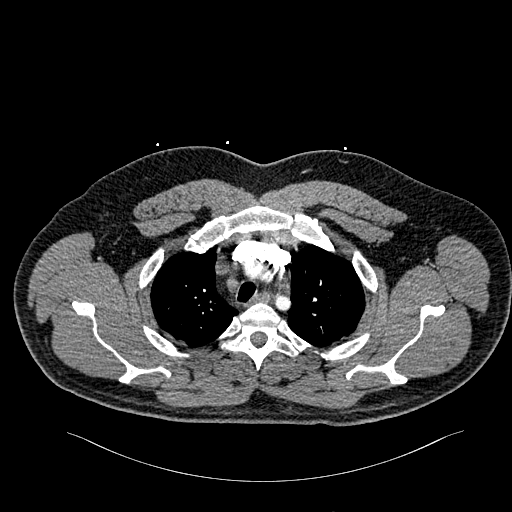
[im 189/225  lung]
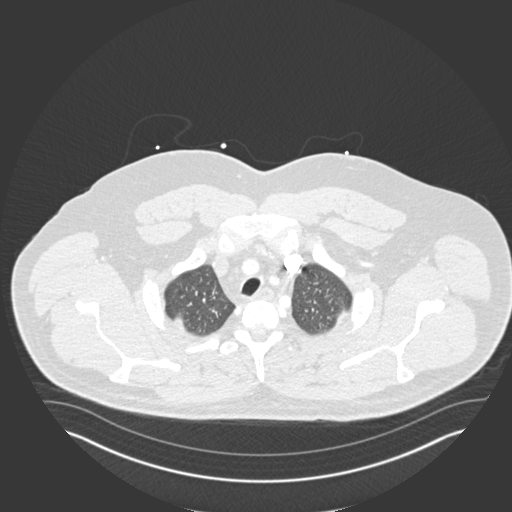
[im 201/225  mediastinal]
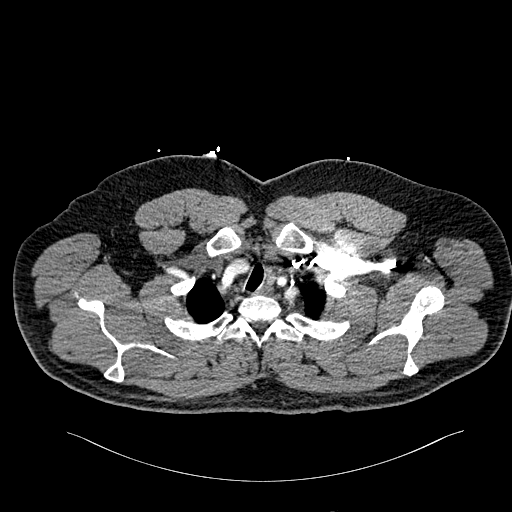
[im 213/225  lung]
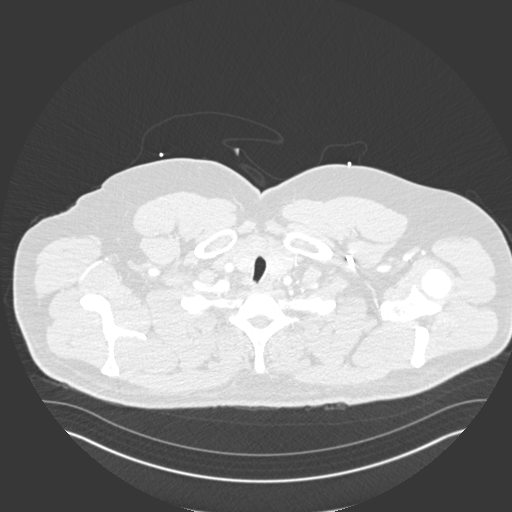

[Series 7: pe coronal mpr · coronal · 0.45mm/px · 1 of 150 slices shown]
[im 75/150  mediastinal]
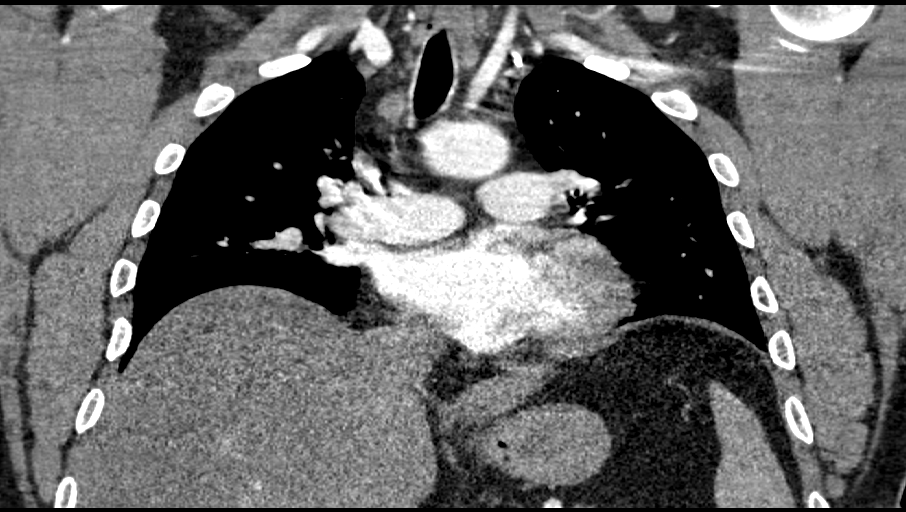

[18 of 36 positions shown; findings below may reference images not displayed]

RADIATION DOSE REDUCTION: This exam was performed according to the
departmental dose-optimization program which includes automated
exposure control, adjustment of the mA and/or kV according to
patient size and/or use of iterative reconstruction technique.

CONTRAST:  75mL OMNIPAQUE IOHEXOL 350 MG/ML SOLN
FINDINGS: Cardiovascular: Satisfactory opacification of the bilateral
pulmonary arteries to the segmental level. No evidence of pulmonary
embolism.

Although not tailored for evaluation of the thoracic aorta, there is
no evidence thoracic aortic aneurysm or dissection.

The heart is top-normal in size.  No pericardial effusion.

Mediastinum/Nodes: No suspicious mediastinal lymphadenopathy.

Visualized thyroid is unremarkable.

Lungs/Pleura: Lungs are clear.

No suspicious pulmonary nodules.

No pleural effusion or pneumothorax.

Upper Abdomen: Visualized upper abdomen is grossly unremarkable.

Musculoskeletal: Mild degenerative changes of the lower thoracic
spine.

Review of the MIP images confirms the above findings.
IMPRESSION: No evidence of pulmonary embolism.

Negative CT chest.

## 2022-09-15 MED ORDER — METHYLPHENIDATE HCL ER (OSM) 18 MG PO TBCR
18.0000 mg | EXTENDED_RELEASE_TABLET | Freq: Every day | ORAL | 0 refills | Status: AC
  Filled 2022-09-15: qty 60, 30d supply, fill #0

## 2022-09-20 ENCOUNTER — Other Ambulatory Visit (HOSPITAL_BASED_OUTPATIENT_CLINIC_OR_DEPARTMENT_OTHER): Payer: Self-pay

## 2022-09-22 ENCOUNTER — Other Ambulatory Visit (HOSPITAL_BASED_OUTPATIENT_CLINIC_OR_DEPARTMENT_OTHER): Payer: Self-pay

## 2022-09-23 ENCOUNTER — Other Ambulatory Visit (HOSPITAL_BASED_OUTPATIENT_CLINIC_OR_DEPARTMENT_OTHER): Payer: Self-pay

## 2022-09-26 ENCOUNTER — Other Ambulatory Visit (HOSPITAL_BASED_OUTPATIENT_CLINIC_OR_DEPARTMENT_OTHER): Payer: Self-pay

## 2022-10-04 ENCOUNTER — Other Ambulatory Visit (HOSPITAL_BASED_OUTPATIENT_CLINIC_OR_DEPARTMENT_OTHER): Payer: Self-pay

## 2022-10-05 ENCOUNTER — Other Ambulatory Visit (HOSPITAL_BASED_OUTPATIENT_CLINIC_OR_DEPARTMENT_OTHER): Payer: Self-pay

## 2022-10-06 ENCOUNTER — Other Ambulatory Visit (HOSPITAL_BASED_OUTPATIENT_CLINIC_OR_DEPARTMENT_OTHER): Payer: Self-pay

## 2022-10-07 ENCOUNTER — Other Ambulatory Visit (HOSPITAL_BASED_OUTPATIENT_CLINIC_OR_DEPARTMENT_OTHER): Payer: Self-pay

## 2022-10-10 ENCOUNTER — Other Ambulatory Visit (HOSPITAL_BASED_OUTPATIENT_CLINIC_OR_DEPARTMENT_OTHER): Payer: Self-pay

## 2022-10-12 ENCOUNTER — Other Ambulatory Visit (HOSPITAL_BASED_OUTPATIENT_CLINIC_OR_DEPARTMENT_OTHER): Payer: Self-pay

## 2022-10-17 ENCOUNTER — Other Ambulatory Visit (HOSPITAL_BASED_OUTPATIENT_CLINIC_OR_DEPARTMENT_OTHER): Payer: Self-pay

## 2022-10-17 MED ORDER — AMPHETAMINE-DEXTROAMPHETAMINE 10 MG PO TABS
10.0000 mg | ORAL_TABLET | Freq: Every day | ORAL | 0 refills | Status: DC | PRN
  Filled 2022-10-17: qty 30, 30d supply, fill #0

## 2022-10-17 MED ORDER — METHYLPHENIDATE HCL ER 18 MG PO TB24
18.0000 mg | ORAL_TABLET | Freq: Every morning | ORAL | 0 refills | Status: DC
  Filled 2022-10-17: qty 30, 30d supply, fill #0

## 2022-10-26 ENCOUNTER — Encounter: Admission: RE | Payer: Self-pay | Source: Home / Self Care

## 2022-10-26 ENCOUNTER — Ambulatory Visit: Admission: RE | Admit: 2022-10-26 | Payer: 59 | Source: Home / Self Care | Admitting: Internal Medicine

## 2022-10-26 SURGERY — COLONOSCOPY
Anesthesia: General

## 2022-11-24 ENCOUNTER — Other Ambulatory Visit: Payer: Self-pay

## 2022-11-24 MED ORDER — METHYLPHENIDATE HCL ER 18 MG PO TB24
18.0000 mg | ORAL_TABLET | Freq: Every morning | ORAL | 0 refills | Status: DC
  Filled 2022-11-24: qty 30, 30d supply, fill #0

## 2022-11-24 MED ORDER — AMPHETAMINE-DEXTROAMPHETAMINE 10 MG PO TABS
10.0000 mg | ORAL_TABLET | Freq: Every day | ORAL | 0 refills | Status: DC | PRN
  Filled 2022-11-24: qty 30, 30d supply, fill #0

## 2022-12-27 ENCOUNTER — Other Ambulatory Visit (HOSPITAL_BASED_OUTPATIENT_CLINIC_OR_DEPARTMENT_OTHER): Payer: Self-pay

## 2022-12-27 MED ORDER — AMPHETAMINE-DEXTROAMPHETAMINE 10 MG PO TABS
10.0000 mg | ORAL_TABLET | Freq: Every day | ORAL | 0 refills | Status: DC | PRN
  Filled 2022-12-27: qty 30, 30d supply, fill #0

## 2022-12-27 MED ORDER — METHYLPHENIDATE HCL ER 18 MG PO TB24
18.0000 mg | ORAL_TABLET | Freq: Every morning | ORAL | 0 refills | Status: DC
  Filled 2022-12-27: qty 30, 30d supply, fill #0

## 2023-01-24 DIAGNOSIS — G4733 Obstructive sleep apnea (adult) (pediatric): Secondary | ICD-10-CM | POA: Diagnosis not present

## 2023-01-25 ENCOUNTER — Encounter: Payer: Self-pay | Admitting: Internal Medicine

## 2023-01-31 ENCOUNTER — Encounter: Payer: Self-pay | Admitting: Internal Medicine

## 2023-01-31 ENCOUNTER — Other Ambulatory Visit (HOSPITAL_BASED_OUTPATIENT_CLINIC_OR_DEPARTMENT_OTHER): Payer: Self-pay

## 2023-01-31 MED ORDER — METHYLPHENIDATE HCL ER 18 MG PO TB24
18.0000 mg | ORAL_TABLET | Freq: Every morning | ORAL | 0 refills | Status: AC
  Filled 2023-01-31: qty 30, 30d supply, fill #0

## 2023-01-31 MED ORDER — AMPHETAMINE-DEXTROAMPHETAMINE 10 MG PO TABS
10.0000 mg | ORAL_TABLET | Freq: Every day | ORAL | 0 refills | Status: AC | PRN
  Filled 2023-01-31: qty 30, 30d supply, fill #0

## 2023-02-01 ENCOUNTER — Encounter
Admission: RE | Disposition: A | Discharge status: Home / Self Care | Payer: Self-pay | Source: Home / Self Care | Attending: Internal Medicine

## 2023-02-01 ENCOUNTER — Ambulatory Visit
Admission: RE | Admit: 2023-02-01 | Discharge: 2023-02-01 | Disposition: A | Discharge status: Home / Self Care | Payer: 59 | Attending: Internal Medicine | Admitting: Internal Medicine

## 2023-02-01 ENCOUNTER — Ambulatory Visit: Payer: 59 | Admitting: Anesthesiology

## 2023-02-01 ENCOUNTER — Encounter: Payer: Self-pay | Admitting: Internal Medicine

## 2023-02-01 DIAGNOSIS — K269 Duodenal ulcer, unspecified as acute or chronic, without hemorrhage or perforation: Secondary | ICD-10-CM | POA: Insufficient documentation

## 2023-02-01 DIAGNOSIS — K900 Celiac disease: Secondary | ICD-10-CM | POA: Insufficient documentation

## 2023-02-01 DIAGNOSIS — D122 Benign neoplasm of ascending colon: Secondary | ICD-10-CM | POA: Diagnosis not present

## 2023-02-01 DIAGNOSIS — Z1211 Encounter for screening for malignant neoplasm of colon: Secondary | ICD-10-CM | POA: Insufficient documentation

## 2023-02-01 DIAGNOSIS — Z09 Encounter for follow-up examination after completed treatment for conditions other than malignant neoplasm: Secondary | ICD-10-CM | POA: Diagnosis not present

## 2023-02-01 DIAGNOSIS — K635 Polyp of colon: Secondary | ICD-10-CM | POA: Diagnosis not present

## 2023-02-01 DIAGNOSIS — Z8719 Personal history of other diseases of the digestive system: Secondary | ICD-10-CM | POA: Diagnosis not present

## 2023-02-01 DIAGNOSIS — K640 First degree hemorrhoids: Secondary | ICD-10-CM | POA: Diagnosis not present

## 2023-02-01 DIAGNOSIS — K2980 Duodenitis without bleeding: Secondary | ICD-10-CM | POA: Diagnosis not present

## 2023-02-01 DIAGNOSIS — K5730 Diverticulosis of large intestine without perforation or abscess without bleeding: Secondary | ICD-10-CM | POA: Diagnosis not present

## 2023-02-01 HISTORY — DX: Essential (primary) hypertension: I10

## 2023-02-01 HISTORY — PX: COLONOSCOPY WITH PROPOFOL: SHX5780

## 2023-02-01 HISTORY — PX: ESOPHAGOGASTRODUODENOSCOPY (EGD) WITH PROPOFOL: SHX5813

## 2023-02-01 SURGERY — COLONOSCOPY WITH PROPOFOL
Anesthesia: General

## 2023-02-01 MED ORDER — MIDAZOLAM HCL 5 MG/5ML IJ SOLN
INTRAMUSCULAR | Status: DC | PRN
  Administered 2023-02-01: 2 mg via INTRAVENOUS

## 2023-02-01 MED ORDER — PROPOFOL 10 MG/ML IV BOLUS
INTRAVENOUS | Status: DC | PRN
  Administered 2023-02-01: 100 mg via INTRAVENOUS

## 2023-02-01 MED ORDER — LIDOCAINE HCL (PF) 2 % IJ SOLN
INTRAMUSCULAR | Status: AC
  Filled 2023-02-01: qty 5

## 2023-02-01 MED ORDER — LIDOCAINE 2% (20 MG/ML) 5 ML SYRINGE
INTRAMUSCULAR | Status: DC | PRN
  Administered 2023-02-01: 20 mg via INTRAVENOUS

## 2023-02-01 MED ORDER — SODIUM CHLORIDE 0.9 % IV SOLN
INTRAVENOUS | Status: DC
  Administered 2023-02-01: 1000 mL via INTRAVENOUS

## 2023-02-01 MED ORDER — GLYCOPYRROLATE 0.2 MG/ML IJ SOLN
INTRAMUSCULAR | Status: DC | PRN
  Administered 2023-02-01: .2 mg via INTRAVENOUS

## 2023-02-01 MED ORDER — GLYCOPYRROLATE 0.2 MG/ML IJ SOLN
INTRAMUSCULAR | Status: AC
  Filled 2023-02-01: qty 1

## 2023-02-01 MED ORDER — MIDAZOLAM HCL 2 MG/2ML IJ SOLN
INTRAMUSCULAR | Status: AC
  Filled 2023-02-01: qty 2

## 2023-02-01 MED ORDER — PROPOFOL 500 MG/50ML IV EMUL
INTRAVENOUS | Status: DC | PRN
  Administered 2023-02-01: 120 ug/kg/min via INTRAVENOUS

## 2023-02-01 MED ORDER — PROPOFOL 1000 MG/100ML IV EMUL
INTRAVENOUS | Status: AC
  Filled 2023-02-01: qty 100

## 2023-02-01 MED ORDER — PROPOFOL 10 MG/ML IV BOLUS
INTRAVENOUS | Status: AC
  Filled 2023-02-01: qty 20

## 2023-02-01 NOTE — Transfer of Care (Signed)
Immediate Anesthesia Transfer of Care Note  Patient: Micheal [name]  Procedure(s) Performed: COLONOSCOPY WITH PROPOFOL ESOPHAGOGASTRODUODENOSCOPY (EGD) WITH PROPOFOL  Patient Location: Endoscopy Unit  Anesthesia Type:General  Level of Consciousness: drowsy  Airway & Oxygen Therapy: Patient Spontanous Breathing  Post-op Assessment: Report given to RN and Post -op Vital signs reviewed and stable  Post vital signs: Reviewed  Last Vitals:  Vitals Value Taken Time  BP    Temp    Pulse    Resp    SpO2      Last Pain:  Vitals:   02/01/23 1333  TempSrc: Temporal  PainSc: 0-No pain         Complications: No notable events documented.

## 2023-02-01 NOTE — Anesthesia Postprocedure Evaluation (Signed)
Anesthesia Post Note  Patient: Micheal [name]  Procedure(s) Performed: COLONOSCOPY WITH PROPOFOL ESOPHAGOGASTRODUODENOSCOPY (EGD) WITH PROPOFOL  Patient location during evaluation: PACU Anesthesia Type: General Level of consciousness: awake and alert, oriented and patient cooperative Pain management: pain level controlled Vital Signs Assessment: post-procedure vital signs reviewed and stable Respiratory status: spontaneous breathing, nonlabored ventilation and respiratory function stable Cardiovascular status: blood pressure returned to baseline and stable Postop Assessment: adequate PO intake Anesthetic complications: no   No notable events documented.   Last Vitals:  Vitals:   02/01/23 1446 02/01/23 1454  BP: (!) 132/93 134/86  Pulse: 89 78  Resp: 17 17  Temp:    SpO2: 100% 100%    Last Pain:  Vitals:   02/01/23 1446  TempSrc:   PainSc: 0-No pain                 Reed Breech

## 2023-02-01 NOTE — Anesthesia Preprocedure Evaluation (Signed)
Anesthesia Evaluation  Patient identified by MRN, date of birth, ID band Patient awake    Reviewed: Allergy & Precautions, NPO status , Patient's Chart, lab work & pertinent test results  History of Anesthesia Complications Negative for: history of anesthetic complications  Airway Mallampati: III   Neck ROM: Full    Dental no notable dental hx.    Pulmonary sleep apnea and Continuous Positive Airway Pressure Ventilation    Pulmonary exam normal breath sounds clear to auscultation       Cardiovascular hypertension, Normal cardiovascular exam Rhythm:Regular Rate:Normal     Neuro/Psych  PSYCHIATRIC DISORDERS (ADHD)      negative neurological ROS     GI/Hepatic negative GI ROS,,,  Endo/Other  Hypothyroidism  Obesity   Renal/GU negative Renal ROS     Musculoskeletal   Abdominal   Peds  Hematology negative hematology ROS (+)   Anesthesia Other Findings   Reproductive/Obstetrics                             Anesthesia Physical Anesthesia Plan  ASA: 2  Anesthesia Plan: General   Post-op Pain Management:    Induction: Intravenous  PONV Risk Score and Plan: 2 and Propofol infusion, TIVA and Treatment may vary due to age or medical condition  Airway Management Planned: Natural Airway  Additional Equipment:   Intra-op Plan:   Post-operative Plan:   Informed Consent: I have reviewed the patients History and Physical, chart, labs and discussed the procedure including the risks, benefits and alternatives for the proposed anesthesia with the patient or authorized representative who has indicated his/her understanding and acceptance.       Plan Discussed with: CRNA  Anesthesia Plan Comments: (LMA/GETA backup discussed.  Patient consented for risks of anesthesia including but not limited to:  - adverse reactions to medications - damage to eyes, teeth, lips or other oral mucosa - nerve  damage due to positioning  - sore throat or hoarseness - damage to heart, brain, nerves, lungs, other parts of body or loss of life  Informed patient about role of CRNA in peri- and intra-operative care.  Patient voiced understanding.)       Anesthesia Quick Evaluation

## 2023-02-01 NOTE — Op Note (Signed)
[facility] Gastroenterology Patient Name: Micheal [name] Procedure Date: 02/01/2023 1:52 PM MRN: [id number] Account #: [id number] Date of Birth: 06/02/1976 Admit Type: Outpatient Age: 47 Room: [facility] ENDO ROOM 4 Gender: Male Note Status: Finalized Instrument Name: Laurette Schimke [id number] Procedure:             Upper GI endoscopy Indications:           Follow-up of duodenal ulcer with obstruction,                         Follow-up of celiac disease Providers:             [name]. Aunesti Pellegrino MD, MD Medicines:             Propofol per Anesthesia Complications:         No immediate complications. Procedure:             Pre-Anesthesia Assessment:                        - The risks and benefits of the procedure and the                         sedation options and risks were discussed with the                         patient. All questions were answered and informed                         consent was obtained.                        - Patient identification and proposed procedure were                         verified prior to the procedure by the nurse. The                         procedure was verified in the procedure room.                        - ASA Grade Assessment: III - A patient with severe                         systemic disease.                        - After reviewing the risks and benefits, the patient                         was deemed in satisfactory condition to undergo the                         procedure.                        After obtaining informed consent, the endoscope was                         passed under direct vision. Throughout the procedure,  the patient's blood pressure, pulse, and oxygen                         saturations were monitored continuously. The Endoscope                         was introduced through the mouth, and advanced to the                         third part of duodenum. The upper GI endoscopy was                          accomplished without difficulty. The patient tolerated                         the procedure well. Findings:      The esophagus was normal.      The stomach was normal.      The examined duodenum was normal. Biopsies for histology were taken with       a cold forceps for evaluation of celiac disease.      There is no endoscopic evidence of inflammation, mucosal abnormalities,       stricture or ulceration in the entire examined duodenum. Impression:            - Normal esophagus.                        - Normal stomach.                        - Normal examined duodenum. Biopsied. Recommendation:        - Await pathology results.                        - Continue GF diet.                        - Proceed with colonoscopy Procedure Code(s):     --- Professional ---                        [phone], Esophagogastroduodenoscopy, flexible,                         transoral; with biopsy, single or multiple Diagnosis Code(s):     --- Professional ---                        K90.0, Celiac disease                        K26.9, Duodenal ulcer, unspecified as acute or                         chronic, without hemorrhage or perforation CPT copyright 2022 American Medical Association. All rights reserved. The codes documented in this report are preliminary and upon coder review may  be revised to meet current compliance requirements. Stanton Kidney MD, MD 02/01/2023 2:12:23 PM This report has been signed electronically. Number of Addenda: 0 Note Initiated On: 02/01/2023 1:52 PM Estimated Blood Loss:  Estimated blood loss: none.      [place]  [facility]

## 2023-02-01 NOTE — H&P (Signed)
Outpatient short stay form Pre-procedure 02/01/2023 1:48 PM Micheal [name] K. Norma Fredrickson, M.D.  Primary Physician: Einar Crow, M.D.  Reason for visit:  Celiac Sprue, hx duodenal ulcer, colon cancer screening  History of present illness:  1. Celiac sprue 2. Hx of duodenal ulcer  - Advised patient to remain strictly compliant with GFD indefinitely - Will re-check Celiac disease panel to confirm adherence to GFD. We will also add on micronutrient panel to check Vit A, Vit E, Vit D, Vit B12, ferritin, iron panel zinc, copper - Repeat EGD to confirm histologic and endoscopic remission and can also assess for healing of duodenal ulcer - Schedule repeat EGD - Offered referral to dietician, but patient declines today - DEXA scan UTD, normal 02/2020  3. Colon cancer screening  - Patient is colonoscopy naive with no known family history of colon cancer, adenomatous polyps, or IBD - We reviewed colon cancer screening options, including hemoccult cards, FIT testing, CT colonography, Cologuard, or screening colonoscopy - Patient would like to proceed with screening colonoscopy for definitive evaluation - Schedule screening colonoscopy - Further recommendations after procedures     Current Facility-Administered Medications:    0.9 %  sodium chloride infusion, , Intravenous, Continuous, Jerlyn Pain, [name], MD  Medications Prior to Admission  Medication Sig Dispense Refill Last Dose   carvedilol (COREG) 6.25 MG tablet Take 6.25 mg by mouth 2 (two) times daily with a meal.   Past Week   levothyroxine (SYNTHROID) 100 MCG tablet Take 1 tablet (100 mcg total) by mouth daily on an empty stomach with a glass of water at least 30-60 minutes before breakfast. 90 tablet 3 01/31/2023   methylphenidate (CONCERTA) 18 MG PO CR tablet Take 1 to 2 tablets by mouth once daily 60 tablet 0 01/31/2023   acetaminophen (TYLENOL) 500 MG tablet Take 500 mg by mouth every 6 (six) hours as needed.       amphetamine-dextroamphetamine (ADDERALL) 10 MG tablet Take 1 tablet (10 mg total) by mouth daily as needed. 30 tablet 0    COVID-19 At Home Antigen Test (CARESTART COVID-19 HOME TEST) KIT Use as directed 2 kit 0    doxycycline (VIBRA-TABS) 100 MG tablet Take 1 tablet (100 mg total) by mouth 2 (two) times daily for 7 days 14 tablet 0    fluticasone (FLONASE) 50 MCG/ACT nasal spray PLACE 2 SPRAYS EACH NOSTRIL ONCE DAILY 16 g 12    fluticasone furoate-vilanterol (BREO ELLIPTA) 100-25 MCG/ACT AEPB Inhale 1 puff into the lungs daily. 60 each 0    fluticasone furoate-vilanterol (BREO ELLIPTA) 100-25 MCG/ACT AEPB Inhale 1 Puff into the lungs once daily (Patient not taking: Reported on 02/01/2023) 60 each 0 Completed Course   influenza vac split quadrivalent PF (FLUARIX QUADRIVALENT) 0.5 ML injection Inject into the muscle. 0.5 mL 0    levothyroxine (SYNTHROID) 50 MCG tablet TAKE 1 TABLET BY MOUTH ONCE DAILY BEFORE BREAKFAST 90 tablet 2    levothyroxine (SYNTHROID) 75 MCG tablet Take 1 tablet (75 mcg total) by mouth once daily on an empty stomach with a glass of water at least 30-60 minutes before breakfast. 90 tablet 3    losartan (COZAAR) 100 MG tablet Take 1 tablet (100 mg total) by mouth once daily 90 tablet 11    losartan (COZAAR) 100 MG tablet Take 1 tablet (100 mg total) by mouth once daily 90 tablet 11    losartan (COZAAR) 100 MG tablet Take 1 tablet (100 mg total) by mouth once daily 90 tablet 3  losartan (COZAAR) 100 MG tablet Take 1 tablet (100 mg total) by mouth once daily 90 tablet 3    losartan (COZAAR) 100 MG tablet Take 1 tablet (100 mg total) by mouth daily. 90 tablet 3    losartan (COZAAR) 100 MG tablet Take 1 tablet (100 mg total) by mouth once daily 90 tablet 3    losartan-hydrochlorothiazide (HYZAAR) 100-12.5 MG tablet Take 1 tablet by mouth once daily 90 tablet 11    methylphenidate (CONCERTA) 18 MG PO CR tablet Take 1 to 2 tablets by mouth once daily 60 tablet 0    methylphenidate  (CONCERTA) 18 MG PO CR tablet Take 1-2 tablets (18-36 mg total) by mouth daily. 60 tablet 0    methylphenidate 18 MG PO CR tablet Take 1-2 tablets (18-36 mg total) by mouth daily. 60 tablet 0    methylphenidate 18 MG PO CR tablet TAKE 1 - 2 TABLETS BY MOUTH ONCE DAILY 60 tablet 0    methylphenidate 18 MG PO CR tablet TAKE 1 - 2 TABLETS BY MOUTH ONCE DAILY 60 tablet 0    methylphenidate 18 MG PO CR tablet TAKE 1-2 TABLETS (18-36 MG TOTAL) BY MOUTH ONCE DAILY 60 tablet 0    methylphenidate 18 MG PO CR tablet Take 1 to 2 tablets by mouth daily 60 tablet 0    methylphenidate (CONCERTA) 18 MG PO CR tablet Take 1-2 tablets (18-36 mg total) by mouth daily. 60 tablet 0    methylphenidate 18 MG PO CR tablet Take 1 to 2 tablets by mouth once daily 60 tablet 0    methylphenidate 18 MG PO CR tablet Take 1 tablet (18 mg total) by mouth every morning. 30 tablet 0    Multiple Vitamin (MULTIVITAMIN) tablet Take 1 tablet by mouth daily.      naproxen sodium (ALEVE) 220 MG tablet Take 220 mg by mouth.      omeprazole (PRILOSEC) 20 MG capsule Take 1 capsule (20 mg total) by mouth daily. 30 capsule 0    omeprazole (PRILOSEC) 40 MG capsule TAKE 1 CAPSULE BY MOUTH ONCE DAILY, 30 MINUTES PRIOR EVENING MEAL 30 capsule 12    ondansetron (ZOFRAN-ODT) 4 MG disintegrating tablet Take 1 tablet (4 mg total) by mouth every 8 (eight) hours as needed for Nausea 20 tablet 0      Allergies  Allergen Reactions   Calcium Channel Blockers     Chest discomfort; negative EKG   Hctz [Hydrochlorothiazide]     cramps     Past Medical History:  Diagnosis Date   ADD (attention deficit disorder)    ADHD (attention deficit hyperactivity disorder), combined type    Adult celiac disease    Sullivan Lone syndrome 2005   total bilirubin  1.8   Hypertension    Hypothyroidism 2005   free T4 0.8; TSH 5.77   Nonspecific elevation of levels of transaminase or lactic acid dehydrogenase (LDH) 2006    ALT 51   Sleep apnea    Vitamin B12  deficiency     Review of systems:  Otherwise negative.    Physical Exam  Gen: Alert, oriented. Appears stated age.  HEENT: [place]/AT. PERRLA. Lungs: CTA, no wheezes. CV: RR nl S1, S2. Abd: soft, benign, no masses. BS+ Ext: No edema. Pulses 2+    Planned procedures: Proceed with EGD and colonoscopy. The patient understands the nature of the planned procedure, indications, risks, alternatives and potential complications including but not limited to bleeding, infection, perforation, damage to internal organs and possible oversedation/side  effects from anesthesia. The patient agrees and gives consent to proceed.  Please refer to procedure notes for findings, recommendations and patient disposition/instructions.     Micheal [name] K. Norma Fredrickson, M.D. Gastroenterology 02/01/2023  1:48 PM

## 2023-02-01 NOTE — Op Note (Signed)
[facility] Gastroenterology Patient Name: Micheal [name] Procedure Date: 02/01/2023 1:52 PM MRN: [id number] Account #: [id number] Date of Birth: Apr 08, 1976 Admit Type: Outpatient Age: 47 Room: [facility] ENDO ROOM 4 Gender: Male Note Status: Finalized Instrument Name: Nelda Marseille [id number] Procedure:             Colonoscopy Indications:           Screening for colorectal malignant neoplasm Providers:             Royce Macadamia K. Harley Fitzwater MD, MD Medicines:             Propofol per Anesthesia Complications:         No immediate complications. Estimated blood loss: None. Procedure:             Pre-Anesthesia Assessment:                        - The risks and benefits of the procedure and the                         sedation options and risks were discussed with the                         patient. All questions were answered and informed                         consent was obtained.                        - Patient identification and proposed procedure were                         verified prior to the procedure by the nurse.                        - ASA Grade Assessment: III - A patient with severe                         systemic disease.                        - After reviewing the risks and benefits, the patient                         was deemed in satisfactory condition to undergo the                         procedure.                        After obtaining informed consent, the colonoscope was                         passed under direct vision. Throughout the procedure,                         the patient's blood pressure, pulse, and oxygen                         saturations were monitored continuously. The  Colonoscope was introduced through the anus and                         advanced to the the cecum, identified by appendiceal                         orifice and ileocecal valve. The colonoscopy was                         performed without  difficulty. The patient tolerated                         the procedure well. The quality of the bowel                         preparation was adequate. The ileocecal valve,                         appendiceal orifice, and rectum were photographed. Findings:      The perianal and digital rectal examinations were normal. Pertinent       negatives include normal sphincter tone and no palpable rectal lesions.      Non-bleeding internal hemorrhoids were found during retroflexion. The       hemorrhoids were Grade I (internal hemorrhoids that do not prolapse).      A few small-mouthed diverticula were found in the sigmoid colon.      Two pedunculated polyps were found in the ascending colon. The polyps       were 12 mm in size. These polyps were removed with a hot snare.       Resection and retrieval were complete.      The exam was otherwise without abnormality. Impression:            - Non-bleeding internal hemorrhoids.                        - Diverticulosis in the sigmoid colon.                        - Two 12 mm polyps in the ascending colon, removed                         with a hot snare. Resected and retrieved.                        - The examination was otherwise normal. Recommendation:        - Await pathology results from EGD, also performed                         today.                        - Gluten free diet.                        - Continue present medications.                        - Repeat colonoscopy for surveillance based on  pathology results.                        - Follow up with Jacob Moores, PA-C in the GI office.                         [phone]                        - Telephone GI office to schedule appointment in 4                         months.                        - The findings and recommendations were discussed with                         the patient. Procedure Code(s):     --- Professional ---                         [phone], Colonoscopy, flexible; with removal of                         tumor(s), polyp(s), or other lesion(s) by snare                         technique Diagnosis Code(s):     --- Professional ---                        K57.30, Diverticulosis of large intestine without                         perforation or abscess without bleeding                        D12.2, Benign neoplasm of ascending colon                        K64.0, First degree hemorrhoids                        Z12.11, Encounter for screening for malignant neoplasm                         of colon CPT copyright 2022 American Medical Association. All rights reserved. The codes documented in this report are preliminary and upon coder review may  be revised to meet current compliance requirements. Stanton Kidney MD, MD 02/01/2023 2:33:10 PM This report has been signed electronically. Number of Addenda: 0 Note Initiated On: 02/01/2023 1:52 PM Scope Withdrawal Time: 0 hours 10 minutes 26 seconds  Total Procedure Duration: 0 hours 13 minutes 49 seconds  Estimated Blood Loss:  Estimated blood loss: none.      [facility]

## 2023-02-01 NOTE — Interval H&P Note (Signed)
History and Physical Interval Note:  02/01/2023 1:51 PM  Micheal [name]  has presented today for surgery, with the diagnosis of 579.0 (ICD-9-CM) - K90.0 (ICD-10-CM) - Celiac disease/sprue V12.79 (ICD-9-CM) - Z87.19 (ICD-10-CM) - History of duodenal ulcer V76.51 (ICD-9-CM) - Z12.11 (ICD-10-CM) - Colon cancer screening.  The various methods of treatment have been discussed with the patient and family. After consideration of risks, benefits and other options for treatment, the patient has consented to  Procedure(s): COLONOSCOPY WITH PROPOFOL (N/A) ESOPHAGOGASTRODUODENOSCOPY (EGD) WITH PROPOFOL (N/A) as a surgical intervention.  The patient's history has been reviewed, patient examined, no change in status, stable for surgery.  I have reviewed the patient's chart and labs.  Questions were answered to the patient's satisfaction.     [place], [place]

## 2023-02-02 ENCOUNTER — Encounter: Payer: Self-pay | Admitting: Internal Medicine

## 2023-02-03 ENCOUNTER — Other Ambulatory Visit (HOSPITAL_BASED_OUTPATIENT_CLINIC_OR_DEPARTMENT_OTHER): Payer: Self-pay

## 2023-02-24 DIAGNOSIS — G4733 Obstructive sleep apnea (adult) (pediatric): Secondary | ICD-10-CM | POA: Diagnosis not present

## 2023-03-23 ENCOUNTER — Other Ambulatory Visit: Payer: Self-pay

## 2023-03-23 MED ORDER — AMPHETAMINE-DEXTROAMPHETAMINE 10 MG PO TABS
10.0000 mg | ORAL_TABLET | Freq: Every day | ORAL | 0 refills | Status: AC | PRN
  Filled 2023-03-23: qty 30, 30d supply, fill #0

## 2023-03-23 MED ORDER — METHYLPHENIDATE HCL ER 27 MG PO TB24
27.0000 mg | ORAL_TABLET | Freq: Every morning | ORAL | 0 refills | Status: DC
  Filled 2023-03-23: qty 30, 30d supply, fill #0

## 2023-03-26 DIAGNOSIS — G4733 Obstructive sleep apnea (adult) (pediatric): Secondary | ICD-10-CM | POA: Diagnosis not present

## 2023-04-06 DIAGNOSIS — G4733 Obstructive sleep apnea (adult) (pediatric): Secondary | ICD-10-CM | POA: Diagnosis not present

## 2023-04-06 DIAGNOSIS — Z0000 Encounter for general adult medical examination without abnormal findings: Secondary | ICD-10-CM | POA: Diagnosis not present

## 2023-04-06 DIAGNOSIS — I10 Essential (primary) hypertension: Secondary | ICD-10-CM | POA: Diagnosis not present

## 2023-04-06 DIAGNOSIS — E039 Hypothyroidism, unspecified: Secondary | ICD-10-CM | POA: Diagnosis not present

## 2023-04-06 DIAGNOSIS — K900 Celiac disease: Secondary | ICD-10-CM | POA: Diagnosis not present

## 2023-04-12 DIAGNOSIS — I10 Essential (primary) hypertension: Secondary | ICD-10-CM | POA: Diagnosis not present

## 2023-04-12 DIAGNOSIS — E039 Hypothyroidism, unspecified: Secondary | ICD-10-CM | POA: Diagnosis not present

## 2023-04-12 DIAGNOSIS — Z0000 Encounter for general adult medical examination without abnormal findings: Secondary | ICD-10-CM | POA: Diagnosis not present

## 2023-04-25 DIAGNOSIS — G4733 Obstructive sleep apnea (adult) (pediatric): Secondary | ICD-10-CM | POA: Diagnosis not present

## 2023-04-26 ENCOUNTER — Other Ambulatory Visit (HOSPITAL_BASED_OUTPATIENT_CLINIC_OR_DEPARTMENT_OTHER): Payer: Self-pay

## 2023-04-26 DIAGNOSIS — G4733 Obstructive sleep apnea (adult) (pediatric): Secondary | ICD-10-CM | POA: Diagnosis not present

## 2023-04-26 MED ORDER — METHYLPHENIDATE HCL ER 27 MG PO TB24
27.0000 mg | ORAL_TABLET | Freq: Every morning | ORAL | 0 refills | Status: DC
  Filled 2023-04-26: qty 30, 30d supply, fill #0

## 2023-04-26 MED ORDER — AMPHETAMINE-DEXTROAMPHETAMINE 10 MG PO TABS
10.0000 mg | ORAL_TABLET | Freq: Every day | ORAL | 0 refills | Status: AC | PRN
  Filled 2023-04-26: qty 30, 30d supply, fill #0

## 2023-05-26 DIAGNOSIS — G4733 Obstructive sleep apnea (adult) (pediatric): Secondary | ICD-10-CM | POA: Diagnosis not present

## 2023-05-27 DIAGNOSIS — G4733 Obstructive sleep apnea (adult) (pediatric): Secondary | ICD-10-CM | POA: Diagnosis not present

## 2023-06-02 ENCOUNTER — Other Ambulatory Visit (HOSPITAL_BASED_OUTPATIENT_CLINIC_OR_DEPARTMENT_OTHER): Payer: Self-pay

## 2023-06-02 MED ORDER — METHYLPHENIDATE HCL 10 MG PO TABS
10.0000 mg | ORAL_TABLET | Freq: Every day | ORAL | 0 refills | Status: DC
  Filled 2023-06-02: qty 30, 30d supply, fill #0

## 2023-06-12 ENCOUNTER — Other Ambulatory Visit (HOSPITAL_BASED_OUTPATIENT_CLINIC_OR_DEPARTMENT_OTHER): Payer: Self-pay

## 2023-06-15 ENCOUNTER — Other Ambulatory Visit (HOSPITAL_BASED_OUTPATIENT_CLINIC_OR_DEPARTMENT_OTHER): Payer: Self-pay

## 2023-06-15 MED ORDER — LOSARTAN POTASSIUM 100 MG PO TABS
100.0000 mg | ORAL_TABLET | Freq: Every day | ORAL | 3 refills | Status: AC
  Filled 2023-06-15: qty 90, 90d supply, fill #0

## 2023-06-15 MED ORDER — LEVOTHYROXINE SODIUM 100 MCG PO TABS
100.0000 ug | ORAL_TABLET | Freq: Every day | ORAL | 3 refills | Status: AC
  Filled 2023-06-15: qty 90, 90d supply, fill #0

## 2023-06-16 ENCOUNTER — Other Ambulatory Visit (HOSPITAL_BASED_OUTPATIENT_CLINIC_OR_DEPARTMENT_OTHER): Payer: Self-pay

## 2023-06-19 ENCOUNTER — Other Ambulatory Visit (HOSPITAL_BASED_OUTPATIENT_CLINIC_OR_DEPARTMENT_OTHER): Payer: Self-pay

## 2023-06-19 ENCOUNTER — Encounter: Payer: Self-pay | Admitting: Pharmacist

## 2023-06-25 DIAGNOSIS — G4733 Obstructive sleep apnea (adult) (pediatric): Secondary | ICD-10-CM | POA: Diagnosis not present

## 2023-06-26 DIAGNOSIS — G4733 Obstructive sleep apnea (adult) (pediatric): Secondary | ICD-10-CM | POA: Diagnosis not present

## 2023-07-06 DIAGNOSIS — D2272 Melanocytic nevi of left lower limb, including hip: Secondary | ICD-10-CM | POA: Diagnosis not present

## 2023-07-06 DIAGNOSIS — D2262 Melanocytic nevi of left upper limb, including shoulder: Secondary | ICD-10-CM | POA: Diagnosis not present

## 2023-07-06 DIAGNOSIS — D2271 Melanocytic nevi of right lower limb, including hip: Secondary | ICD-10-CM | POA: Diagnosis not present

## 2023-07-06 DIAGNOSIS — D225 Melanocytic nevi of trunk: Secondary | ICD-10-CM | POA: Diagnosis not present

## 2023-07-06 DIAGNOSIS — L821 Other seborrheic keratosis: Secondary | ICD-10-CM | POA: Diagnosis not present

## 2023-07-06 DIAGNOSIS — D2261 Melanocytic nevi of right upper limb, including shoulder: Secondary | ICD-10-CM | POA: Diagnosis not present

## 2023-07-07 DIAGNOSIS — H40002 Preglaucoma, unspecified, left eye: Secondary | ICD-10-CM | POA: Diagnosis not present

## 2023-07-10 ENCOUNTER — Other Ambulatory Visit (HOSPITAL_BASED_OUTPATIENT_CLINIC_OR_DEPARTMENT_OTHER): Payer: Self-pay

## 2023-07-17 DIAGNOSIS — Z87828 Personal history of other (healed) physical injury and trauma: Secondary | ICD-10-CM | POA: Diagnosis not present

## 2023-07-17 DIAGNOSIS — H47092 Other disorders of optic nerve, not elsewhere classified, left eye: Secondary | ICD-10-CM | POA: Diagnosis not present

## 2023-07-19 ENCOUNTER — Other Ambulatory Visit (HOSPITAL_BASED_OUTPATIENT_CLINIC_OR_DEPARTMENT_OTHER): Payer: Self-pay

## 2023-07-19 MED ORDER — METHYLPHENIDATE HCL ER 27 MG PO TB24
27.0000 mg | ORAL_TABLET | Freq: Every morning | ORAL | 0 refills | Status: DC
  Filled 2023-07-19: qty 10, 10d supply, fill #0

## 2023-07-19 MED ORDER — METHYLPHENIDATE HCL 10 MG PO TABS
10.0000 mg | ORAL_TABLET | Freq: Every day | ORAL | 0 refills | Status: DC
  Filled 2023-07-19: qty 30, 30d supply, fill #0

## 2023-07-20 ENCOUNTER — Other Ambulatory Visit (HOSPITAL_BASED_OUTPATIENT_CLINIC_OR_DEPARTMENT_OTHER): Payer: Self-pay

## 2023-07-21 ENCOUNTER — Other Ambulatory Visit (HOSPITAL_BASED_OUTPATIENT_CLINIC_OR_DEPARTMENT_OTHER): Payer: Self-pay

## 2023-07-24 ENCOUNTER — Other Ambulatory Visit (HOSPITAL_BASED_OUTPATIENT_CLINIC_OR_DEPARTMENT_OTHER): Payer: Self-pay

## 2023-07-24 DIAGNOSIS — G4733 Obstructive sleep apnea (adult) (pediatric): Secondary | ICD-10-CM | POA: Diagnosis not present

## 2023-07-26 ENCOUNTER — Other Ambulatory Visit (HOSPITAL_BASED_OUTPATIENT_CLINIC_OR_DEPARTMENT_OTHER): Payer: Self-pay

## 2023-07-27 DIAGNOSIS — G4733 Obstructive sleep apnea (adult) (pediatric): Secondary | ICD-10-CM | POA: Diagnosis not present

## 2023-08-02 ENCOUNTER — Other Ambulatory Visit (HOSPITAL_BASED_OUTPATIENT_CLINIC_OR_DEPARTMENT_OTHER): Payer: Self-pay

## 2023-08-03 ENCOUNTER — Other Ambulatory Visit (HOSPITAL_BASED_OUTPATIENT_CLINIC_OR_DEPARTMENT_OTHER): Payer: Self-pay

## 2023-08-03 ENCOUNTER — Other Ambulatory Visit: Payer: Self-pay

## 2023-08-03 MED ORDER — METHYLPHENIDATE HCL ER 27 MG PO TB24
27.0000 mg | ORAL_TABLET | Freq: Every morning | ORAL | 0 refills | Status: DC
  Filled 2023-08-03: qty 30, 30d supply, fill #0

## 2023-08-04 ENCOUNTER — Other Ambulatory Visit: Payer: Self-pay

## 2023-08-07 DIAGNOSIS — E039 Hypothyroidism, unspecified: Secondary | ICD-10-CM | POA: Diagnosis not present

## 2023-08-07 DIAGNOSIS — I10 Essential (primary) hypertension: Secondary | ICD-10-CM | POA: Diagnosis not present

## 2023-08-07 DIAGNOSIS — G4733 Obstructive sleep apnea (adult) (pediatric): Secondary | ICD-10-CM | POA: Diagnosis not present

## 2023-08-07 DIAGNOSIS — F988 Other specified behavioral and emotional disorders with onset usually occurring in childhood and adolescence: Secondary | ICD-10-CM | POA: Diagnosis not present

## 2023-08-23 DIAGNOSIS — G4733 Obstructive sleep apnea (adult) (pediatric): Secondary | ICD-10-CM | POA: Diagnosis not present

## 2023-08-26 DIAGNOSIS — G4733 Obstructive sleep apnea (adult) (pediatric): Secondary | ICD-10-CM | POA: Diagnosis not present

## 2023-09-12 ENCOUNTER — Other Ambulatory Visit: Payer: Self-pay

## 2023-09-12 MED ORDER — METHYLPHENIDATE HCL ER 27 MG PO TB24
27.0000 mg | ORAL_TABLET | Freq: Every morning | ORAL | 0 refills | Status: DC
  Filled 2023-09-12: qty 30, 30d supply, fill #0

## 2023-09-12 MED ORDER — METHYLPHENIDATE HCL 10 MG PO TABS
15.0000 mg | ORAL_TABLET | Freq: Every day | ORAL | 0 refills | Status: AC | PRN
  Filled 2023-09-12: qty 45, 30d supply, fill #0

## 2023-09-13 ENCOUNTER — Other Ambulatory Visit: Payer: Self-pay

## 2023-09-23 DIAGNOSIS — G4733 Obstructive sleep apnea (adult) (pediatric): Secondary | ICD-10-CM | POA: Diagnosis not present

## 2023-09-26 DIAGNOSIS — G4733 Obstructive sleep apnea (adult) (pediatric): Secondary | ICD-10-CM | POA: Diagnosis not present

## 2023-10-25 ENCOUNTER — Other Ambulatory Visit: Payer: Self-pay

## 2023-10-25 MED ORDER — METHYLPHENIDATE HCL ER 27 MG PO TB24
27.0000 mg | ORAL_TABLET | Freq: Every morning | ORAL | 0 refills | Status: DC
  Filled 2023-10-25: qty 20, 20d supply, fill #0
  Filled 2023-10-25: qty 10, 10d supply, fill #0

## 2023-10-25 MED ORDER — METHYLPHENIDATE HCL 10 MG PO TABS
15.0000 mg | ORAL_TABLET | Freq: Every day | ORAL | 0 refills | Status: AC
  Filled 2023-10-25: qty 45, 30d supply, fill #0

## 2023-10-25 MED ORDER — LEVOTHYROXINE SODIUM 100 MCG PO TABS
100.0000 ug | ORAL_TABLET | Freq: Every day | ORAL | 1 refills | Status: AC
  Filled 2023-10-25: qty 90, 90d supply, fill #0
  Filled 2024-04-02: qty 90, 90d supply, fill #1
  Filled 2024-08-30 – 2024-09-04 (×3): qty 90, 90d supply, fill #0

## 2023-10-25 MED ORDER — LOSARTAN POTASSIUM 100 MG PO TABS
100.0000 mg | ORAL_TABLET | Freq: Every day | ORAL | 1 refills | Status: DC
  Filled 2023-10-25: qty 90, 90d supply, fill #0
  Filled 2024-05-07: qty 90, 90d supply, fill #1

## 2023-10-26 ENCOUNTER — Other Ambulatory Visit: Payer: Self-pay

## 2023-10-26 MED ORDER — ONDANSETRON HCL 8 MG PO TABS
8.0000 mg | ORAL_TABLET | Freq: Three times a day (TID) | ORAL | 0 refills | Status: AC | PRN
  Filled 2023-10-26: qty 20, 7d supply, fill #0

## 2023-10-27 DIAGNOSIS — G4733 Obstructive sleep apnea (adult) (pediatric): Secondary | ICD-10-CM | POA: Diagnosis not present

## 2023-11-28 ENCOUNTER — Other Ambulatory Visit: Payer: Self-pay

## 2023-11-28 MED ORDER — METHYLPHENIDATE HCL 10 MG PO TABS
15.0000 mg | ORAL_TABLET | Freq: Every day | ORAL | 0 refills | Status: DC | PRN
  Filled 2023-11-28: qty 45, 30d supply, fill #0

## 2023-11-28 MED ORDER — METHYLPHENIDATE HCL ER 27 MG PO TB24
27.0000 mg | ORAL_TABLET | Freq: Every morning | ORAL | 0 refills | Status: DC
  Filled 2023-11-28: qty 30, 30d supply, fill #0

## 2023-12-07 ENCOUNTER — Other Ambulatory Visit: Payer: Self-pay

## 2023-12-07 ENCOUNTER — Other Ambulatory Visit (HOSPITAL_BASED_OUTPATIENT_CLINIC_OR_DEPARTMENT_OTHER): Payer: Self-pay

## 2023-12-07 MED ORDER — AMOXICILLIN 500 MG PO CAPS
500.0000 mg | ORAL_CAPSULE | Freq: Three times a day (TID) | ORAL | 1 refills | Status: AC
  Filled 2023-12-07: qty 30, 10d supply, fill #0

## 2023-12-07 MED ORDER — CELECOXIB 100 MG PO CAPS
100.0000 mg | ORAL_CAPSULE | Freq: Two times a day (BID) | ORAL | 0 refills | Status: AC | PRN
  Filled 2023-12-07 (×2): qty 60, 30d supply, fill #0

## 2023-12-18 ENCOUNTER — Other Ambulatory Visit: Payer: Self-pay

## 2023-12-18 MED ORDER — AMOXICILLIN 500 MG PO CAPS
500.0000 mg | ORAL_CAPSULE | Freq: Three times a day (TID) | ORAL | 0 refills | Status: AC
  Filled 2023-12-18: qty 9, 3d supply, fill #0

## 2024-01-02 ENCOUNTER — Other Ambulatory Visit: Payer: Self-pay

## 2024-01-02 MED ORDER — METHYLPHENIDATE HCL ER 27 MG PO TB24
27.0000 mg | ORAL_TABLET | Freq: Every morning | ORAL | 0 refills | Status: DC
  Filled 2024-01-02: qty 30, 30d supply, fill #0

## 2024-01-02 MED ORDER — METHYLPHENIDATE HCL 10 MG PO TABS
15.0000 mg | ORAL_TABLET | Freq: Every day | ORAL | 0 refills | Status: DC | PRN
Start: 2024-01-02 — End: 2024-02-05
  Filled 2024-01-02: qty 45, 30d supply, fill #0

## 2024-02-02 ENCOUNTER — Other Ambulatory Visit: Payer: Self-pay

## 2024-02-05 ENCOUNTER — Other Ambulatory Visit: Payer: Self-pay

## 2024-02-05 MED ORDER — METHYLPHENIDATE HCL ER 27 MG PO TB24
27.0000 mg | ORAL_TABLET | Freq: Every morning | ORAL | 0 refills | Status: DC
  Filled 2024-02-05: qty 30, 30d supply, fill #0

## 2024-02-05 MED ORDER — METHYLPHENIDATE HCL 10 MG PO TABS
15.0000 mg | ORAL_TABLET | Freq: Every day | ORAL | 0 refills | Status: AC | PRN
  Filled 2024-02-05: qty 45, 30d supply, fill #0

## 2024-02-27 ENCOUNTER — Other Ambulatory Visit: Payer: Self-pay

## 2024-02-27 MED ORDER — LEVOTHYROXINE SODIUM 100 MCG PO TABS
100.0000 ug | ORAL_TABLET | Freq: Every day | ORAL | 1 refills | Status: AC
  Filled 2024-02-27: qty 90, 90d supply, fill #0
  Filled 2024-06-06: qty 90, 90d supply, fill #1

## 2024-02-27 MED ORDER — METHYLPHENIDATE HCL 10 MG PO TABS
10.0000 mg | ORAL_TABLET | Freq: Every day | ORAL | 0 refills | Status: AC
  Filled 2024-04-02: qty 45, 45d supply, fill #0

## 2024-02-27 MED ORDER — METHYLPHENIDATE HCL ER (OSM) 27 MG PO TBCR
27.0000 mg | EXTENDED_RELEASE_TABLET | Freq: Every morning | ORAL | 0 refills | Status: DC
  Filled 2024-04-02: qty 30, 30d supply, fill #0

## 2024-03-29 DIAGNOSIS — Z1331 Encounter for screening for depression: Secondary | ICD-10-CM | POA: Diagnosis not present

## 2024-03-29 DIAGNOSIS — Z0000 Encounter for general adult medical examination without abnormal findings: Secondary | ICD-10-CM | POA: Diagnosis not present

## 2024-03-29 DIAGNOSIS — I10 Essential (primary) hypertension: Secondary | ICD-10-CM | POA: Diagnosis not present

## 2024-03-29 DIAGNOSIS — F988 Other specified behavioral and emotional disorders with onset usually occurring in childhood and adolescence: Secondary | ICD-10-CM | POA: Diagnosis not present

## 2024-03-29 DIAGNOSIS — E039 Hypothyroidism, unspecified: Secondary | ICD-10-CM | POA: Diagnosis not present

## 2024-03-29 DIAGNOSIS — K900 Celiac disease: Secondary | ICD-10-CM | POA: Diagnosis not present

## 2024-03-29 DIAGNOSIS — G4733 Obstructive sleep apnea (adult) (pediatric): Secondary | ICD-10-CM | POA: Diagnosis not present

## 2024-03-29 DIAGNOSIS — Z125 Encounter for screening for malignant neoplasm of prostate: Secondary | ICD-10-CM | POA: Diagnosis not present

## 2024-04-02 ENCOUNTER — Other Ambulatory Visit: Payer: Self-pay

## 2024-04-02 MED ORDER — METHYLPHENIDATE HCL 10 MG PO TABS
15.0000 mg | ORAL_TABLET | Freq: Every day | ORAL | 0 refills | Status: AC | PRN
  Filled 2024-04-02 – 2024-04-03 (×2): qty 45, 30d supply, fill #0

## 2024-04-03 ENCOUNTER — Other Ambulatory Visit: Payer: Self-pay

## 2024-04-09 ENCOUNTER — Other Ambulatory Visit: Payer: Self-pay

## 2024-04-09 MED ORDER — METHYLPHENIDATE HCL 10 MG PO TABS: 10.0000 mg | ORAL_TABLET | Freq: Every day | ORAL | 0 refills | Status: AC | PRN

## 2024-05-07 ENCOUNTER — Other Ambulatory Visit: Payer: Self-pay

## 2024-05-07 MED ORDER — METHYLPHENIDATE HCL ER (OSM) 27 MG PO TBCR
27.0000 mg | EXTENDED_RELEASE_TABLET | ORAL | 0 refills | Status: AC
  Filled 2024-05-07: qty 30, 30d supply, fill #0

## 2024-05-07 MED ORDER — METHYLPHENIDATE HCL 10 MG PO TABS
15.0000 mg | ORAL_TABLET | Freq: Every day | ORAL | 0 refills | Status: AC | PRN
  Filled 2024-05-07: qty 45, 30d supply, fill #0

## 2024-05-10 ENCOUNTER — Other Ambulatory Visit: Payer: Self-pay

## 2024-05-27 ENCOUNTER — Other Ambulatory Visit (HOSPITAL_BASED_OUTPATIENT_CLINIC_OR_DEPARTMENT_OTHER): Payer: Self-pay

## 2024-05-27 MED ORDER — SCOPOLAMINE 1 MG/3DAYS TD PT72
MEDICATED_PATCH | TRANSDERMAL | 0 refills | Status: AC
  Filled 2024-05-27 – 2024-05-28 (×3): qty 10, 30d supply, fill #0

## 2024-05-28 ENCOUNTER — Other Ambulatory Visit (HOSPITAL_BASED_OUTPATIENT_CLINIC_OR_DEPARTMENT_OTHER): Payer: Self-pay

## 2024-05-28 ENCOUNTER — Other Ambulatory Visit: Payer: Self-pay

## 2024-06-06 ENCOUNTER — Other Ambulatory Visit: Payer: Self-pay

## 2024-06-06 MED ORDER — METHYLPHENIDATE HCL ER 27 MG PO TB24
27.0000 mg | ORAL_TABLET | Freq: Every morning | ORAL | 0 refills | Status: DC
  Filled 2024-06-06: qty 30, 30d supply, fill #0

## 2024-06-06 MED ORDER — METHYLPHENIDATE HCL 10 MG PO TABS
15.0000 mg | ORAL_TABLET | Freq: Every day | ORAL | 0 refills | Status: AC | PRN
  Filled 2024-06-06 – 2024-06-07 (×2): qty 45, 30d supply, fill #0

## 2024-06-07 ENCOUNTER — Other Ambulatory Visit: Payer: Self-pay

## 2024-06-07 ENCOUNTER — Encounter (HOSPITAL_COMMUNITY): Payer: Self-pay | Admitting: Pharmacist

## 2024-06-07 ENCOUNTER — Other Ambulatory Visit (HOSPITAL_COMMUNITY): Payer: Self-pay

## 2024-07-10 DIAGNOSIS — D2272 Melanocytic nevi of left lower limb, including hip: Secondary | ICD-10-CM | POA: Diagnosis not present

## 2024-07-10 DIAGNOSIS — L821 Other seborrheic keratosis: Secondary | ICD-10-CM | POA: Diagnosis not present

## 2024-07-10 DIAGNOSIS — L308 Other specified dermatitis: Secondary | ICD-10-CM | POA: Diagnosis not present

## 2024-07-10 DIAGNOSIS — D2262 Melanocytic nevi of left upper limb, including shoulder: Secondary | ICD-10-CM | POA: Diagnosis not present

## 2024-07-10 DIAGNOSIS — D485 Neoplasm of uncertain behavior of skin: Secondary | ICD-10-CM | POA: Diagnosis not present

## 2024-07-10 DIAGNOSIS — L929 Granulomatous disorder of the skin and subcutaneous tissue, unspecified: Secondary | ICD-10-CM | POA: Diagnosis not present

## 2024-07-10 DIAGNOSIS — D225 Melanocytic nevi of trunk: Secondary | ICD-10-CM | POA: Diagnosis not present

## 2024-07-10 DIAGNOSIS — D2261 Melanocytic nevi of right upper limb, including shoulder: Secondary | ICD-10-CM | POA: Diagnosis not present

## 2024-07-10 DIAGNOSIS — D2271 Melanocytic nevi of right lower limb, including hip: Secondary | ICD-10-CM | POA: Diagnosis not present

## 2024-07-17 ENCOUNTER — Other Ambulatory Visit: Payer: Self-pay

## 2024-07-17 MED ORDER — METHYLPHENIDATE HCL 10 MG PO TABS
15.0000 mg | ORAL_TABLET | Freq: Every day | ORAL | 0 refills | Status: AC | PRN
Start: 2024-07-17 — End: ?
  Filled 2024-07-17: qty 45, 30d supply, fill #0

## 2024-07-17 MED ORDER — METHYLPHENIDATE HCL ER 27 MG PO TB24
27.0000 mg | ORAL_TABLET | Freq: Every morning | ORAL | 0 refills | Status: DC
  Filled 2024-07-17: qty 30, 30d supply, fill #0

## 2024-07-18 ENCOUNTER — Other Ambulatory Visit: Payer: Self-pay

## 2024-07-23 ENCOUNTER — Other Ambulatory Visit: Payer: Self-pay

## 2024-08-09 ENCOUNTER — Other Ambulatory Visit: Payer: Self-pay

## 2024-08-09 MED ORDER — AMOXICILLIN 500 MG PO CAPS
500.0000 mg | ORAL_CAPSULE | Freq: Three times a day (TID) | ORAL | 1 refills | Status: AC
  Filled 2024-08-09: qty 30, 10d supply, fill #0

## 2024-08-21 ENCOUNTER — Other Ambulatory Visit: Payer: Self-pay

## 2024-08-21 MED ORDER — METHYLPHENIDATE HCL ER (OSM) 27 MG PO TBCR
27.0000 mg | EXTENDED_RELEASE_TABLET | Freq: Every morning | ORAL | 0 refills | Status: AC
  Filled 2024-08-21: qty 30, 30d supply, fill #0

## 2024-08-21 MED ORDER — METHYLPHENIDATE HCL 10 MG PO TABS
15.0000 mg | ORAL_TABLET | Freq: Every day | ORAL | 0 refills | Status: AC | PRN
  Filled 2024-08-21: qty 45, 30d supply, fill #0

## 2024-08-30 ENCOUNTER — Other Ambulatory Visit: Payer: Self-pay

## 2024-08-30 ENCOUNTER — Other Ambulatory Visit (HOSPITAL_BASED_OUTPATIENT_CLINIC_OR_DEPARTMENT_OTHER): Payer: Self-pay

## 2024-09-02 ENCOUNTER — Other Ambulatory Visit: Payer: Self-pay

## 2024-09-02 MED ORDER — LEVOTHYROXINE SODIUM 100 MCG PO TABS
ORAL_TABLET | ORAL | 3 refills | Status: AC
  Filled 2024-09-02: qty 90, 90d supply, fill #0

## 2024-09-03 ENCOUNTER — Other Ambulatory Visit: Payer: Self-pay

## 2024-09-03 ENCOUNTER — Other Ambulatory Visit (HOSPITAL_BASED_OUTPATIENT_CLINIC_OR_DEPARTMENT_OTHER): Payer: Self-pay

## 2024-09-03 MED ORDER — LOSARTAN POTASSIUM 100 MG PO TABS
100.0000 mg | ORAL_TABLET | Freq: Every day | ORAL | 1 refills | Status: AC
  Filled 2024-09-03 – 2024-09-04 (×2): qty 90, 90d supply, fill #0

## 2024-09-04 ENCOUNTER — Other Ambulatory Visit: Payer: Self-pay

## 2024-09-05 ENCOUNTER — Other Ambulatory Visit: Payer: Self-pay

## 2024-09-05 ENCOUNTER — Other Ambulatory Visit (HOSPITAL_COMMUNITY): Payer: Self-pay
